# Patient Record
Sex: Female | Born: 1945
Health system: Southern US, Community
[De-identification: ages and names within clinical notes are randomized; demographics above are authoritative.]

## PROBLEM LIST (undated history)

## (undated) DIAGNOSIS — T7840XA Allergy, unspecified, initial encounter: Secondary | ICD-10-CM

## (undated) DIAGNOSIS — E039 Hypothyroidism, unspecified: Secondary | ICD-10-CM

## (undated) DIAGNOSIS — M1990 Unspecified osteoarthritis, unspecified site: Secondary | ICD-10-CM

## (undated) DIAGNOSIS — K2270 Barrett's esophagus without dysplasia: Secondary | ICD-10-CM

## (undated) DIAGNOSIS — E785 Hyperlipidemia, unspecified: Secondary | ICD-10-CM

## (undated) DIAGNOSIS — K219 Gastro-esophageal reflux disease without esophagitis: Secondary | ICD-10-CM

## (undated) DIAGNOSIS — J302 Other seasonal allergic rhinitis: Secondary | ICD-10-CM

## (undated) HISTORY — DX: Barrett's esophagus without dysplasia: K22.70

## (undated) HISTORY — DX: Other seasonal allergic rhinitis: J30.2

## (undated) HISTORY — DX: Hypothyroidism, unspecified: E03.9

## (undated) HISTORY — DX: Hyperlipidemia, unspecified: E78.5

## (undated) HISTORY — DX: Unspecified osteoarthritis, unspecified site: M19.90

## (undated) HISTORY — PX: ABDOMINAL HYSTERECTOMY: SHX81

## (undated) HISTORY — PX: NASAL SINUS SURGERY: SHX719

## (undated) HISTORY — DX: Allergy, unspecified, initial encounter: T78.40XA

## (undated) HISTORY — PX: CARPAL TUNNEL RELEASE: SHX101

## (undated) HISTORY — DX: Gastro-esophageal reflux disease without esophagitis: K21.9

---
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
## 1978-06-29 HISTORY — PX: TONSILLECTOMY: SUR1361

## 1998-06-29 HISTORY — PX: NEUROPLASTY / TRANSPOSITION MEDIAN NERVE AT CARPAL TUNNEL BILATERAL: SUR894

## 1998-09-20 ENCOUNTER — Ambulatory Visit (HOSPITAL_BASED_OUTPATIENT_CLINIC_OR_DEPARTMENT_OTHER): Admission: RE | Admit: 1998-09-20 | Discharge: 1998-09-20 | Payer: Self-pay | Admitting: Neurosurgery

## 1999-04-03 ENCOUNTER — Inpatient Hospital Stay (HOSPITAL_COMMUNITY): Admission: RE | Admit: 1999-04-03 | Discharge: 1999-04-04 | Payer: Self-pay | Admitting: Gynecology

## 2000-10-13 ENCOUNTER — Other Ambulatory Visit: Admission: RE | Admit: 2000-10-13 | Discharge: 2000-10-13 | Payer: Self-pay | Admitting: Gynecology

## 2000-11-19 ENCOUNTER — Encounter: Payer: Self-pay | Admitting: Family Medicine

## 2000-11-19 ENCOUNTER — Encounter: Admission: RE | Admit: 2000-11-19 | Discharge: 2000-11-19 | Payer: Self-pay | Admitting: Family Medicine

## 2000-12-22 ENCOUNTER — Encounter: Admission: RE | Admit: 2000-12-22 | Discharge: 2000-12-22 | Payer: Self-pay | Admitting: Family Medicine

## 2000-12-22 ENCOUNTER — Encounter: Payer: Self-pay | Admitting: Family Medicine

## 2001-02-16 ENCOUNTER — Other Ambulatory Visit: Admission: RE | Admit: 2001-02-16 | Discharge: 2001-02-16 | Payer: Self-pay | Admitting: Gastroenterology

## 2001-02-16 ENCOUNTER — Encounter (INDEPENDENT_AMBULATORY_CARE_PROVIDER_SITE_OTHER): Payer: Self-pay | Admitting: Specialist

## 2001-02-16 ENCOUNTER — Encounter: Payer: Self-pay | Admitting: Gastroenterology

## 2002-11-13 ENCOUNTER — Other Ambulatory Visit: Admission: RE | Admit: 2002-11-13 | Discharge: 2002-11-13 | Payer: Self-pay | Admitting: Gynecology

## 2003-11-19 ENCOUNTER — Other Ambulatory Visit: Admission: RE | Admit: 2003-11-19 | Discharge: 2003-11-19 | Payer: Self-pay | Admitting: Gynecology

## 2004-09-10 ENCOUNTER — Ambulatory Visit: Payer: Self-pay | Admitting: *Deleted

## 2004-11-18 ENCOUNTER — Ambulatory Visit: Payer: Self-pay | Admitting: Gastroenterology

## 2004-11-27 ENCOUNTER — Other Ambulatory Visit: Admission: RE | Admit: 2004-11-27 | Discharge: 2004-11-27 | Payer: Self-pay | Admitting: Gynecology

## 2005-06-05 ENCOUNTER — Ambulatory Visit: Payer: Self-pay | Admitting: Gastroenterology

## 2005-11-30 ENCOUNTER — Other Ambulatory Visit: Admission: RE | Admit: 2005-11-30 | Discharge: 2005-11-30 | Payer: Self-pay | Admitting: Gynecology

## 2006-11-05 ENCOUNTER — Ambulatory Visit: Payer: Self-pay | Admitting: Gastroenterology

## 2006-12-06 ENCOUNTER — Ambulatory Visit: Payer: Self-pay | Admitting: Gastroenterology

## 2006-12-07 ENCOUNTER — Encounter: Payer: Self-pay | Admitting: Gastroenterology

## 2006-12-13 ENCOUNTER — Other Ambulatory Visit: Admission: RE | Admit: 2006-12-13 | Discharge: 2006-12-13 | Payer: Self-pay | Admitting: Gynecology

## 2007-06-30 HISTORY — PX: ROTATOR CUFF REPAIR: SHX139

## 2008-02-08 ENCOUNTER — Telehealth: Payer: Self-pay | Admitting: Gastroenterology

## 2008-04-26 ENCOUNTER — Ambulatory Visit: Payer: Self-pay | Admitting: Internal Medicine

## 2008-05-09 ENCOUNTER — Telehealth: Payer: Self-pay | Admitting: Internal Medicine

## 2008-05-10 ENCOUNTER — Ambulatory Visit: Payer: Self-pay | Admitting: Internal Medicine

## 2008-06-29 HISTORY — PX: OTHER SURGICAL HISTORY: SHX169

## 2008-07-06 DIAGNOSIS — K219 Gastro-esophageal reflux disease without esophagitis: Secondary | ICD-10-CM | POA: Insufficient documentation

## 2008-07-06 DIAGNOSIS — K449 Diaphragmatic hernia without obstruction or gangrene: Secondary | ICD-10-CM | POA: Insufficient documentation

## 2008-07-06 DIAGNOSIS — Z8719 Personal history of other diseases of the digestive system: Secondary | ICD-10-CM | POA: Insufficient documentation

## 2008-07-10 ENCOUNTER — Ambulatory Visit: Payer: Self-pay | Admitting: Gastroenterology

## 2008-11-09 ENCOUNTER — Telehealth: Payer: Self-pay | Admitting: Gastroenterology

## 2008-12-26 ENCOUNTER — Encounter: Payer: Self-pay | Admitting: Internal Medicine

## 2009-05-27 ENCOUNTER — Encounter: Payer: Self-pay | Admitting: Internal Medicine

## 2010-01-07 ENCOUNTER — Telehealth: Payer: Self-pay | Admitting: Gastroenterology

## 2010-01-07 ENCOUNTER — Encounter: Payer: Self-pay | Admitting: Internal Medicine

## 2010-07-29 ENCOUNTER — Ambulatory Visit
Admission: RE | Admit: 2010-07-29 | Discharge: 2010-07-29 | Payer: Self-pay | Source: Home / Self Care | Attending: Gastroenterology | Admitting: Gastroenterology

## 2010-07-29 ENCOUNTER — Other Ambulatory Visit: Payer: Self-pay | Admitting: Gastroenterology

## 2010-07-29 DIAGNOSIS — E039 Hypothyroidism, unspecified: Secondary | ICD-10-CM | POA: Insufficient documentation

## 2010-07-29 LAB — CBC WITH DIFFERENTIAL/PLATELET
Basophils Absolute: 0 10*3/uL (ref 0.0–0.1)
Basophils Relative: 0.3 % (ref 0.0–3.0)
Eosinophils Absolute: 0.1 10*3/uL (ref 0.0–0.7)
Eosinophils Relative: 2.3 % (ref 0.0–5.0)
HCT: 37.7 % (ref 36.0–46.0)
Hemoglobin: 13 g/dL (ref 12.0–15.0)
Lymphocytes Relative: 24.5 % (ref 12.0–46.0)
Lymphs Abs: 1.5 10*3/uL (ref 0.7–4.0)
MCHC: 34.5 g/dL (ref 30.0–36.0)
MCV: 94.2 fl (ref 78.0–100.0)
Monocytes Absolute: 0.4 10*3/uL (ref 0.1–1.0)
Monocytes Relative: 6.5 % (ref 3.0–12.0)
Neutro Abs: 4 10*3/uL (ref 1.4–7.7)
Neutrophils Relative %: 66.4 % (ref 43.0–77.0)
Platelets: 231 10*3/uL (ref 150.0–400.0)
RBC: 4 Mil/uL (ref 3.87–5.11)
RDW: 13.1 % (ref 11.5–14.6)
WBC: 6.1 10*3/uL (ref 4.5–10.5)

## 2010-07-29 LAB — VITAMIN B12: Vitamin B-12: 483 pg/mL (ref 211–911)

## 2010-07-29 LAB — BASIC METABOLIC PANEL
BUN: 14 mg/dL (ref 6–23)
CO2: 33 mEq/L — ABNORMAL HIGH (ref 19–32)
Calcium: 9 mg/dL (ref 8.4–10.5)
Chloride: 105 mEq/L (ref 96–112)
Creatinine, Ser: 0.7 mg/dL (ref 0.4–1.2)
GFR: 92.9 mL/min (ref >60.00)
Glucose, Bld: 80 mg/dL (ref 70–99)
Potassium: 3.9 mEq/L (ref 3.5–5.1)
Sodium: 143 mEq/L (ref 135–145)

## 2010-07-29 LAB — TSH: TSH: 6.35 u[IU]/mL — ABNORMAL HIGH (ref 0.35–5.50)

## 2010-07-29 LAB — HEPATIC FUNCTION PANEL
ALT: 36 U/L — ABNORMAL HIGH (ref 0–35)
AST: 25 U/L (ref 0–37)
Albumin: 3.8 g/dL (ref 3.5–5.2)
Alkaline Phosphatase: 138 U/L — ABNORMAL HIGH (ref 39–117)
Bilirubin, Direct: 0.1 mg/dL (ref 0.0–0.3)
Total Bilirubin: 0.2 mg/dL — ABNORMAL LOW (ref 0.3–1.2)
Total Protein: 6.4 g/dL (ref 6.0–8.3)

## 2010-07-29 LAB — IBC PANEL
Iron: 39 ug/dL — ABNORMAL LOW (ref 42–145)
Saturation Ratios: 10.6 % — ABNORMAL LOW (ref 20.0–50.0)
Transferrin: 262.3 mg/dL (ref 212.0–360.0)

## 2010-07-29 LAB — FERRITIN: Ferritin: 66.8 ng/mL (ref 10.0–291.0)

## 2010-07-29 LAB — FOLATE: Folate: 12.1 ng/mL (ref >5.9)

## 2010-07-31 NOTE — Progress Notes (Signed)
Summary: Nexium Refill  Medications Added NEXIUM 40 MG  CPDR (ESOMEPRAZOLE MAGNESIUM) 1 capsule twice a day 30 minutes before meals       Phone Note From Pharmacy   Summary of Call: Fax rec from [facility] req refill for Nexium two times a day. Initial call taken by: Ashok Cordia RN,  January 07, 2010 8:25 AM    New/Updated Medications: NEXIUM 40 MG  CPDR (ESOMEPRAZOLE MAGNESIUM) 1 capsule twice a day 30 minutes before meals Prescriptions: NEXIUM 40 MG  CPDR (ESOMEPRAZOLE MAGNESIUM) 1 capsule twice a day 30 minutes before meals  #60 x 6   Entered by:   Ashok Cordia RN   Authorized by:   Mardella Layman MD [facility]   Signed by:   Ashok Cordia RN on 01/07/2010   Method used:   Electronically to        [facility]* (retail)       [address]       [place], [place]  [address]       Ph: [phone]       Fax: [phone]   RxID:   [id number]

## 2010-07-31 NOTE — Letter (Signed)
Summary: Beather Arbour MD  Beather Arbour MD   Imported By: Lanelle Bal 01/17/2010 10:13:06  _____________________________________________________________________  External Attachment:    Type:   Image     Comment:   External Document

## 2010-08-06 NOTE — Assessment & Plan Note (Signed)
Summary: barretts follow up/lk    History of Present Illness Visit Type: Follow-up Visit Primary GI MD: Sheryn Bison MD FACP FAGA Primary Provider: Beather Arbour, MD Requesting Provider: n/a Chief Complaint: Patient here for routine follow up on Barretts Esophagus. She also needs refills of Nexium. History of Present Illness:   65 year old Caucasian female with chronic acid reflux and Barrett's mucosa doing well on Nexium 40 mg twice a day. She denies GI complaints, specifically chest pain, dysphagia, nausea vomiting, bowel irregularity, melena or hematochezia. Her last colonoscopy was 10 years ago. Family history is noncontributory. In addition the Nexium she is on aspirin 81 mg a day, Synthroid, and Zocor.   GI Review of Systems    Reports acid reflux.      Denies abdominal pain, belching, bloating, chest pain, dysphagia with liquids, dysphagia with solids, heartburn, loss of appetite, nausea, vomiting, vomiting blood, weight loss, and  weight gain.        Denies anal fissure, black tarry stools, change in bowel habit, constipation, diarrhea, diverticulosis, fecal incontinence, heme positive stool, hemorrhoids, irritable bowel syndrome, jaundice, light color stool, liver problems, rectal bleeding, and  rectal pain. Preventive Screening-Counseling & Management  Caffeine-Diet-Exercise     Does Patient Exercise: yes    Current Medications (verified): 1)  Nexium 40 Mg  Cpdr (Esomeprazole Magnesium) .Marland Kitchen.. 1 Capsule Twice A Day 30 Minutes Before Meals 2)  Aspirin 81 Mg  Tabs (Aspirin) .Marland Kitchen.. 1 Tablet By Mouth Once Daily 3)  Calcium Carbonate   Powd (Calcium Carbonate) .Marland Kitchen.. 1 Tablet By Mouth Once Daily 4)  Vitamin D 1000 Unit  Tabs (Cholecalciferol) .Marland Kitchen.. 1 Tablet By Mouth Once Daily 5)  Dulcolax Stool Softener 100 Mg Caps (Docusate Sodium) .... As Needed 6)  Synthroid 50 Mcg Tabs (Levothyroxine Sodium) .... Take 1 Tablet By Mouth Once A Day 7)  Zocor 10 Mg Tabs (Simvastatin) .... Take 1  Tablet By Mouth Once A Day  Allergies (verified): No Known Drug Allergies  Past History:  Past medical, surgical, family and social histories (including risk factors) reviewed for relevance to current acute and chronic problems.  Past Medical History: Reviewed history from 07/06/2008 and no changes required. Current Problems:  HIATAL HERNIA (ICD-553.3) GERD (ICD-530.81) BARRETT'S ESOPHAGUS, HX OF (ICD-V12.79)  Past Surgical History: Hysterectomy Sinus surgery appendectomy carpal tunnel release-bilateral T & A  Bladder Tack Rotator Cuff Repair and spur removal of the right shoulder trigger thumb release-right  Family History: Reviewed history from 07/10/2008 and no changes required. Family History of Breast Cancer: Maternal Aunt No FH of Colon Cancer: Family History of Diabetes: Paternal Aunt x 2 Family History of Heart Disease: Brother, father, grandfather Family History of Pancreatic Cancer:Maternal Aunt  Social History: Reviewed history from 07/10/2008 and no changes required. Alcohol Use - yes-seldom Illicit Drug Use - no Patient has never smoked.  Daily Caffeine Use Patient gets regular exercise. Does Patient Exercise:  yes  Review of Systems       The patient complains of allergy/sinus.  The patient denies anemia, anxiety-new, arthritis/joint pain, back pain, blood in urine, breast changes/lumps, change in vision, confusion, cough, coughing up blood, depression-new, fainting, fatigue, fever, headaches-new, hearing problems, heart murmur, heart rhythm changes, itching, menstrual pain, muscle pains/cramps, night sweats, nosebleeds, pregnancy symptoms, shortness of breath, skin rash, sleeping problems, sore throat, swelling of feet/legs, swollen lymph glands, thirst - excessive , urination - excessive , urination changes/pain, urine leakage, vision changes, and voice change.    Vital Signs:  Patient profile:  65 year old female Height:      65 inches Weight:       151 pounds BMI:     25.22 BSA:     1.76 Pulse rate:   88 / minute Pulse rhythm:   regular BP sitting:   120 / 78  (left arm)  Vitals Entered By: Lamona Curl CMA Duncan Dull) (July 29, 2010 3:02 PM)  Physical Exam  General:  Well developed, well nourished, no acute distress.healthy appearing.   Head:  Normocephalic and atraumatic. Eyes:  PERRLA, no icterus.exam deferred to patient's ophthalmologist.   Nose:  nasal congestion and slight tenderness over the frontal sinus area. Neck:  Supple; no masses or thyromegaly. Lungs:  Clear throughout to auscultation. Heart:  Regular rate and rhythm; no murmurs, rubs,  or bruits. Abdomen:  Soft, nontender and nondistended. No masses, hepatosplenomegaly or hernias noted. Normal bowel sounds. Msk:  Symmetrical with no gross deformities. Normal posture. Extremities:  No clubbing, cyanosis, edema or deformities noted. Neurologic:  Alert and  oriented x4;  grossly normal neurologically. Cervical Nodes:  No significant cervical adenopathy. Psych:  Alert and cooperative. Normal mood and affect.   Impression & Recommendations:  Problem # 1:  BARRETT'S ESOPHAGUS, HX OF (ICD-V12.79) Assessment Unchanged Followup endoscopy and esophageal biopsy scheduled at her convenience. She is to continue her antireflux maneuvers and daily PPI therapy.Screening labs ordered per chronic PPI use. Orders: TLB-CBC Platelet - w/Differential (85025-CBCD) TLB-BMP (Basic Metabolic Panel-BMET) (80048-METABOL) TLB-Hepatic/Liver Function Pnl (80076-HEPATIC) TLB-TSH (Thyroid Stimulating Hormone) (84443-TSH) TLB-B12, Serum-Total ONLY ([address]-U04) TLB-Ferritin (82728-FER) TLB-Folic Acid (Folate) (82746-FOL) TLB-IBC Pnl (Iron/FE;Transferrin) (83550-IBC)  Problem # 2:  SCREENING COLORECTAL-CANCER (ICD-V76.51) Assessment: Unchanged colonoscopy also scheduled her convenience.  Problem # 3:  HYPOTHYROIDISM (ICD-244.9) Assessment: Improved continue Synthroid and Zocor as  per Dr. Beather Arbour.  Patient Instructions: 1)  Copy sent to : Beather Arbour, MD 2)  Please go to the basement today for your labs.  3)  Your prescription(s) have been sent to you pharmacy.  4)  Call back to schedule your colonoscopy & endoscopy and previsit. 5)  The medication list was reviewed and reconciled.  All changed / newly prescribed medications were explained.  A complete medication list was provided to the patient / caregiver. 6)  Barrett's Esophagus brochure given.  7)  Avoid foods high in acid content ( tomatoes, citrus juices, spicy foods) . Avoid eating within 3 to 4 hours of lying down or before exercising. Do not over eat; try smaller more frequent meals. Elevate head of bed four inches when sleeping.  Prescriptions: ZITHROMAX Z-PAK 250 MG TABS (AZITHROMYCIN) take as directed  #1 pak x 0   Entered by:   Harlow Mares CMA (AAMA)   Authorized by:   Mardella Layman MD [facility]   Signed by:   Mardella Layman MD [facility] on 07/29/2010   Method used:   Electronically to        [facility]* (retail)       [address]       [place], [place]  [address]       Ph: [phone]       Fax: [phone]   RxID:   [id number] NEXIUM 40 MG  CPDR (ESOMEPRAZOLE MAGNESIUM) 1 capsule twice a day 30 minutes before meals  #180 x 3   Entered by:   Harlow Mares CMA (AAMA)   Authorized by:   Mardella Layman MD [facility]   Signed by:   Harlow Mares CMA (AAMA) on 07/29/2010  Method used:   Faxed to ...       [address] [facility] (mail-order)       [address]       [place], [place]  [address]       Ph: [phone]       Fax: [phone]   RxID:   [id number]

## 2010-08-13 ENCOUNTER — Telehealth: Payer: Self-pay | Admitting: Gastroenterology

## 2010-08-20 NOTE — Progress Notes (Signed)
Summary: Results   Phone Note Call from Patient Call back at [phone]   Caller: Patient Call For: Dr. Jarold Motto Reason for Call: Talk to Nurse Summary of Call: Pt is calling for results from her blood work Initial call taken by: [place] Johnson,  August 13, 2010 9:03 AM  Follow-up for Phone Call        advised of labs and biscommed to lomax again.  Follow-up by: Harlow Mares CMA (AAMA),  August 13, 2010 9:09 AM

## 2010-11-11 NOTE — Assessment & Plan Note (Signed)
[facility]                         GASTROENTEROLOGY OFFICE NOTE   Monique, Ellis                           MRN:          [id number]  DATE:11/05/2006                            DOB:          15-Feb-1946    Monique Ellis is a 65 year old white female, who has chronic acid reflux  with constant throat-clearing and hoarseness, partially relieved by  taking Nexium 40 mg twice a day.  She does have endoscopically-  documented Barrett's mucosa in her distal esophagus with a negative  endoscopy approximately three years ago, without any dysplasia.  She  also had screening colonoscopy in August of 2002 that was unremarkable  and she has no family history of colon carcinoma or colonic polyposis.   She is doing well currently and denies other complaints, except for a  globus sensation in her throat.  Her bowels are moving normally and she  denies melena or hematochezia.  Her appetite is good and her weight is  stable.  She denies any new medical problems since she was last seen and  she is taking daily Metamucil, calcium with vitamin D daily, Vivelle  patch and Singulair 10 mg a day.  She does Hemoccult cards for Dr. Nicholas Lose  yearly and these apparently have been negative.   She is a healthy-appearing, attractive, white female, in no distress.  She weighs 148 pounds.  Her blood pressure is 130/78.  Pulse was 80 and  regular.  I could not appreciate stigmata of chronic liver disease.  Her  chest was clear.  She appeared to be in a regular rhythm without  murmurs, gallops or rubs.  There was no hepatosplenomegaly, abdominal  masses or tenderness.  Bowel sounds were normal.   ASSESSMENT:  1. Chronic GERD and Barrett's mucosa with some mild continued      extraesophageal manifestations of GERD, despite twice-a-day PPI      therapy.  This patient has had previous 24-hour pH probe testing,      but I cannot see where she has actually had manometry done.  2. Increased  personal stress over the last several years with multiple      deaths in her family.  She denies any problems with anxiety or      depression at this time, however.   RECOMMENDATIONS:  1. Anti-reflux maneuvers again reviewed with patient.  2. Continue twice-a-day Nexium therapy.  I have advised her to take      this at least 30 minutes before meals.  3. Followup endoscopy at her convenience.  4. Continue yearly Hemoccult cards with colonoscopy in five years'      time, unless otherwise indicated.     Vania Rea. Jarold Motto, MD, [facility]ale[facility] Essex, FAGA  Electronically Signed    DRP/MedQ  DD: 11/05/2006  DT: 11/05/2006  Job #: [id number]   cc:   Vale Haven. Andrey Campanile, M.D.  Gretta Cool, M.D.

## 2011-01-13 ENCOUNTER — Other Ambulatory Visit: Payer: Self-pay | Admitting: Gynecology

## 2011-03-18 ENCOUNTER — Encounter: Payer: Self-pay | Admitting: Gastroenterology

## 2011-03-18 NOTE — Progress Notes (Signed)
Pt was seen last 07/29/2010 for barretts follow up and was advised to call back to schedule her ECL but she has yet to call back, I received a request for her Nexium I have faxed back the form and denied the medication since she has not been seen in the office in over a year and she needs to have her procedures done as well. Once she is seen in the office we will refill her medication but not until.

## 2011-03-27 ENCOUNTER — Encounter: Payer: Self-pay | Admitting: Gastroenterology

## 2011-03-31 ENCOUNTER — Other Ambulatory Visit: Payer: Self-pay | Admitting: Gastroenterology

## 2011-03-31 MED ORDER — ESOMEPRAZOLE MAGNESIUM 40 MG PO CPDR: 40.0000 mg | DELAYED_RELEASE_CAPSULE | Freq: Two times a day (BID) | ORAL | Status: DC

## 2011-03-31 NOTE — Telephone Encounter (Signed)
Rx Sent to pharmacy.  

## 2011-08-17 ENCOUNTER — Encounter: Payer: Self-pay | Admitting: Gastroenterology

## 2011-08-27 ENCOUNTER — Other Ambulatory Visit: Payer: Self-pay | Admitting: Gastroenterology

## 2011-08-27 ENCOUNTER — Ambulatory Visit (AMBULATORY_SURGERY_CENTER): Payer: 59 | Admitting: *Deleted

## 2011-08-27 ENCOUNTER — Encounter: Payer: Self-pay | Admitting: Gastroenterology

## 2011-08-27 VITALS — Ht 65.0 in | Wt 152.0 lb

## 2011-08-27 DIAGNOSIS — K2270 Barrett's esophagus without dysplasia: Secondary | ICD-10-CM

## 2011-08-27 DIAGNOSIS — Z1211 Encounter for screening for malignant neoplasm of colon: Secondary | ICD-10-CM

## 2011-08-27 MED ORDER — PEG-KCL-NACL-NASULF-NA ASC-C 100 G PO SOLR: ORAL | Status: DC

## 2011-08-27 NOTE — Telephone Encounter (Addendum)
Left message to call back pt last ov 07/29/2010 she will need ov after her egd to follow up on her barretts. We can send a 30 day supply to her pharmacy.

## 2011-08-31 DIAGNOSIS — K2270 Barrett's esophagus without dysplasia: Secondary | ICD-10-CM | POA: Insufficient documentation

## 2011-08-31 DIAGNOSIS — Z1211 Encounter for screening for malignant neoplasm of colon: Secondary | ICD-10-CM | POA: Insufficient documentation

## 2011-08-31 MED ORDER — ESOMEPRAZOLE MAGNESIUM 40 MG PO CPDR: 40.0000 mg | DELAYED_RELEASE_CAPSULE | Freq: Two times a day (BID) | ORAL | Status: DC

## 2011-09-01 ENCOUNTER — Telehealth: Payer: Self-pay | Admitting: Gastroenterology

## 2011-09-01 NOTE — Telephone Encounter (Signed)
Problem resolved yesterday

## 2011-09-11 ENCOUNTER — Telehealth: Payer: Self-pay | Admitting: Gastroenterology

## 2011-09-11 ENCOUNTER — Encounter: Payer: Self-pay | Admitting: Gastroenterology

## 2011-09-11 ENCOUNTER — Ambulatory Visit (AMBULATORY_SURGERY_CENTER): Payer: 59 | Admitting: Gastroenterology

## 2011-09-11 VITALS — BP 138/85 | HR 75 | Temp 97.1°F | Resp 18 | Ht 65.0 in | Wt 152.0 lb

## 2011-09-11 DIAGNOSIS — K2990 Gastroduodenitis, unspecified, without bleeding: Secondary | ICD-10-CM

## 2011-09-11 DIAGNOSIS — K2270 Barrett's esophagus without dysplasia: Secondary | ICD-10-CM

## 2011-09-11 DIAGNOSIS — K209 Esophagitis, unspecified without bleeding: Secondary | ICD-10-CM

## 2011-09-11 DIAGNOSIS — K2970 Gastritis, unspecified, without bleeding: Secondary | ICD-10-CM

## 2011-09-11 DIAGNOSIS — K219 Gastro-esophageal reflux disease without esophagitis: Secondary | ICD-10-CM

## 2011-09-11 DIAGNOSIS — Z8719 Personal history of other diseases of the digestive system: Secondary | ICD-10-CM

## 2011-09-11 DIAGNOSIS — Z1211 Encounter for screening for malignant neoplasm of colon: Secondary | ICD-10-CM

## 2011-09-11 MED ORDER — ESOMEPRAZOLE MAGNESIUM 40 MG PO CPDR: 40.0000 mg | DELAYED_RELEASE_CAPSULE | Freq: Two times a day (BID) | ORAL | Status: DC

## 2011-09-11 MED ORDER — SODIUM CHLORIDE 0.9 % IV SOLN: 500.0000 mL | INTRAVENOUS | Status: DC

## 2011-09-11 NOTE — Progress Notes (Signed)
Patient did not experience any of the following events: a burn prior to discharge; a fall within the facility; wrong site/side/patient/procedure/implant event; or a hospital transfer or hospital admission upon discharge from the facility. (G8907) Patient did not have preoperative order for IV antibiotic SSI prophylaxis. (G8918)  

## 2011-09-11 NOTE — Op Note (Signed)
[facility] [address] [place], [place]  [address]  COLONOSCOPY PROCEDURE REPORT  PATIENT:  Monique, Ellis  MR#:  [id number] BIRTHDATE:  1945/10/08, 66 yrs. old  GENDER:  female ENDOSCOPIST:  Vania Rea. Jarold Motto, MD, [facility] REF. BY: PROCEDURE DATE:  09/11/2011 PROCEDURE:  Average-risk screening colonoscopy G0121 ASA CLASS:  Class II INDICATIONS:  Routine Risk Screening MEDICATIONS:   propofol (Diprivan) 180 mg IV  DESCRIPTION OF PROCEDURE:   After the risks and benefits and of the procedure were explained, informed consent was obtained. Digital rectal exam was performed and revealed no abnormalities. The LB 180AL [id number] endoscope was introduced through the anus and advanced to the cecum, which was identified by both the appendix and ileocecal valve.  The quality of the prep was excellent, using MoviPrep.  The instrument was then slowly withdrawn as the colon was fully examined. <<PROCEDUREIMAGES>>  FINDINGS:  No polyps or cancers were seen.  This was otherwise a normal examination of the colon.   Retroflexed views in the rectum revealed no abnormalities.    The scope was then withdrawn from the patient and the procedure completed.  COMPLICATIONS:  None ENDOSCOPIC IMPRESSION: 1) No polyps or cancers 2) Otherwise normal examination RECOMMENDATIONS: 1) Continue current colorectal screening recommendations for "routine risk" patients with a repeat colonoscopy in 10 years.  REPEAT EXAM:  No  ______________________________ Vania Rea. Jarold Motto, MD, [facility]  CC:  Karie Schwalbe, MD  n. Rosalie DoctorMarland Kitchen   Vania Rea. Armie Moren at 09/11/2011 02:55 PM  Susanne Greenhouse, [phone]

## 2011-09-11 NOTE — Patient Instructions (Signed)

## 2011-09-11 NOTE — Telephone Encounter (Signed)
Attempted to contact patient and she states that I have the wrong number. Called and left a message at home number.

## 2011-09-11 NOTE — Op Note (Signed)
[facility] [address] [place], [place]  [address]  ENDOSCOPY PROCEDURE REPORT  PATIENT:  Monique, Ellis  MR#:  [id number] BIRTHDATE:  [birth year], 66 yrs. old  GENDER:  female  ENDOSCOPIST:  Vania Rea. Jarold Motto, MD, [facility] Referred by:  PROCEDURE DATE:  09/11/2011 PROCEDURE:  EGD with biopsy, 43239, EGD with biopsy for H. pylori 19147 ASA CLASS:  Class II INDICATIONS:  h/o Barrett's Esophagus  MEDICATIONS:   There was residual sedation effect present from prior procedure., propofol (Diprivan) 100 mg IV TOPICAL ANESTHETIC:  DESCRIPTION OF PROCEDURE:   After the risks and benefits of the procedure were explained, informed consent was obtained.  The LB GIF-H180 [id number] endoscope was introduced through the mouth and advanced to the second portion of the duodenum.  The instrument was slowly withdrawn as the mucosa was fully examined. <<PROCEDUREIMAGES>>  Mild gastritis was found in the antrum. CLO BX. DONE  Otherwise the examination was normal. GE JUNCTION BIOPSIED.?? SHORT SEGMENT BARRETT'S.    Retroflexed views revealed no abnormalities.    The scope was then withdrawn from the patient and the procedure completed.  COMPLICATIONS:  None  ENDOSCOPIC IMPRESSION: 1) Mild gastritis in the antrum 2) Otherwise normal examination HX OF CHRONIC GERD ON RX.R/O BARRETT'S MUCOSA RECOMMENDATIONS: 1) Await biopsy results 2) Rx CLO if positive 3) continue current medications  ______________________________ Vania Rea. Jarold Motto, MD, [facility]  CC:  Karie Schwalbe, MD  n. Rosalie DoctorMarland Kitchen   Vania Rea. Kandon Hosking at 09/11/2011 03:09 PM  Susanne Greenhouse, [phone]

## 2011-09-11 NOTE — Telephone Encounter (Signed)
Pt walked in and wanted to have her Nexium prescription resent because it was sent as qty of 90 and should have read qty of 180 for 90 day supply.  Rx was corrected and resent.  Pt aware

## 2011-09-14 ENCOUNTER — Encounter: Payer: Self-pay | Admitting: Gastroenterology

## 2011-09-14 ENCOUNTER — Telehealth: Payer: Self-pay | Admitting: *Deleted

## 2011-09-14 LAB — HELICOBACTER PYLORI SCREEN-BIOPSY: UREASE: NEGATIVE

## 2011-09-14 NOTE — Telephone Encounter (Signed)
  Follow up Call-  Call back number 09/11/2011  Post procedure Call Back phone  # [phone]  Permission to leave phone message Yes     Patient questions:  Do you have a fever, pain , or abdominal swelling? no Pain Score  0 *  Have you tolerated food without any problems? yes  Have you been able to return to your normal activities? yes  Do you have any questions about your discharge instructions: Diet   no Medications  no Follow up visit  no  Do you have questions or concerns about your Care? no  Actions: * If pain score is 4 or above: No action needed, pain <4.

## 2011-09-15 ENCOUNTER — Encounter: Payer: Self-pay | Admitting: Gastroenterology

## 2011-10-08 ENCOUNTER — Other Ambulatory Visit: Payer: Self-pay | Admitting: Dermatology

## 2011-11-25 ENCOUNTER — Encounter: Payer: Self-pay | Admitting: *Deleted

## 2011-11-26 ENCOUNTER — Ambulatory Visit (INDEPENDENT_AMBULATORY_CARE_PROVIDER_SITE_OTHER): Payer: 59 | Admitting: Gastroenterology

## 2011-11-26 ENCOUNTER — Encounter: Payer: Self-pay | Admitting: Gastroenterology

## 2011-11-26 VITALS — BP 128/78 | HR 76 | Ht 65.0 in | Wt 152.6 lb

## 2011-11-26 DIAGNOSIS — Z8719 Personal history of other diseases of the digestive system: Secondary | ICD-10-CM

## 2011-11-26 MED ORDER — ESOMEPRAZOLE MAGNESIUM 40 MG PO CPDR: 40.0000 mg | DELAYED_RELEASE_CAPSULE | Freq: Two times a day (BID) | ORAL | Status: DC

## 2011-11-26 NOTE — Patient Instructions (Signed)
Your prescription(s) have been sent to you pharmacy.   

## 2011-11-26 NOTE — Progress Notes (Signed)
History of Present Illness: This is a extremely pleasant 66 year old Caucasian female with chronic acid reflux. There was some question as to possible Barrett's mucosa, but recent endoscopy and esophageal biopsies was unremarkable. Biopsies for H. pylori also were negative. Screening colonoscopy was unremarkable. This patient's symptoms in the past have been mostly extra esophageal with a globus sensation and throat clearing. Currently on Nexium 40 mg a day she is asymptomatic. Review of her labs shows no specific abnormalities.    Current Medications, Allergies, Past Medical History, Past Surgical History, Family History and Social History were reviewed in [employer] record.   Assessment and plan: Chronic GERD without Barrett's mucosa. She is doing well on daily PPI therapy, B12 levels have been normal, and she is on calcium and vitamin D per Dr. Nicholas Lose. We will see her on when necessary basis as needed. Exam today showed her blood pressure to 120/78, pulse 76 and regular, weight 152 pounds, and BMI 25.39. Please copy to Dr. Beather Arbour with this note. No diagnosis found.

## 2012-01-14 ENCOUNTER — Other Ambulatory Visit: Payer: Self-pay | Admitting: Gynecology

## 2012-02-03 ENCOUNTER — Telehealth: Payer: Self-pay | Admitting: Gastroenterology

## 2012-02-03 NOTE — Telephone Encounter (Signed)
I have requested a prior authorization form.

## 2012-02-03 NOTE — Telephone Encounter (Signed)
Prior auth initiated.

## 2012-07-04 ENCOUNTER — Telehealth: Payer: Self-pay | Admitting: Gastroenterology

## 2012-07-05 NOTE — Telephone Encounter (Signed)
Prior Authorization was started in August by Karen Kitchens and patient called yesterday to check on status.  I called [facility] and spoke with Caryn Bee to check on status. Caryn Bee stated that prior authorization is not required, the problem is with pharmacy/formulary.  I was transferred to pharmacy and spoke with [facility].  Renee stated that the Nexium extended release capsules are a plan exclusive and if patient wants this medication she will have to pay out of pocket $1300.00 monthly. Renee stated that even Nexium capsules will not be covered under formulary. The only Nexium that is covered is the powder form.  I called patient to advise her of this.  Patient said that she has this problem all the time with insurance company and she will call them herself and call me back

## 2012-11-30 ENCOUNTER — Other Ambulatory Visit: Payer: Self-pay | Admitting: *Deleted

## 2012-11-30 ENCOUNTER — Telehealth: Payer: Self-pay | Admitting: Gastroenterology

## 2012-11-30 MED ORDER — ESOMEPRAZOLE MAGNESIUM 40 MG PO CPDR: 40.0000 mg | DELAYED_RELEASE_CAPSULE | Freq: Two times a day (BID) | ORAL | Status: DC

## 2012-11-30 MED ORDER — ESOMEPRAZOLE MAGNESIUM 40 MG PO CPDR: 40.0000 mg | DELAYED_RELEASE_CAPSULE | Freq: Every day | ORAL | Status: DC

## 2012-11-30 NOTE — Telephone Encounter (Signed)
Rx sent 

## 2012-11-30 NOTE — Telephone Encounter (Signed)
Sent rx for once daily when it should be twice daily Changed to twice daily Called pharmacy and they made note in system because prescription will not show for 24 hrs

## 2013-02-09 ENCOUNTER — Other Ambulatory Visit: Payer: Self-pay | Admitting: Gastroenterology

## 2013-06-01 ENCOUNTER — Other Ambulatory Visit: Payer: Self-pay | Admitting: Gastroenterology

## 2013-11-09 ENCOUNTER — Encounter: Payer: Self-pay | Admitting: Internal Medicine

## 2013-11-13 ENCOUNTER — Encounter: Payer: Self-pay | Admitting: Internal Medicine

## 2013-11-13 ENCOUNTER — Other Ambulatory Visit (INDEPENDENT_AMBULATORY_CARE_PROVIDER_SITE_OTHER): Payer: 59

## 2013-11-13 ENCOUNTER — Ambulatory Visit (INDEPENDENT_AMBULATORY_CARE_PROVIDER_SITE_OTHER): Payer: 59 | Admitting: Internal Medicine

## 2013-11-13 VITALS — BP 140/82 | HR 84 | Ht 65.0 in | Wt 152.0 lb

## 2013-11-13 DIAGNOSIS — K2270 Barrett's esophagus without dysplasia: Secondary | ICD-10-CM

## 2013-11-13 DIAGNOSIS — K219 Gastro-esophageal reflux disease without esophagitis: Secondary | ICD-10-CM

## 2013-11-13 LAB — MAGNESIUM: MAGNESIUM: 1.8 mg/dL (ref 1.5–2.5)

## 2013-11-13 MED ORDER — ESOMEPRAZOLE MAGNESIUM 40 MG PO CPDR: 40.0000 mg | DELAYED_RELEASE_CAPSULE | Freq: Two times a day (BID) | ORAL | Status: DC

## 2013-11-13 NOTE — Patient Instructions (Signed)
Your physician has requested that you go to the basement for the following lab work before leaving today: magnesium   We have sent the following medications to your pharmacy for you to pick up at your convenience: Esomeprazole 40 mg twice a day  Follow up in 1 year

## 2013-11-13 NOTE — Progress Notes (Signed)
   Subjective:    Patient ID: Monique Ellis, female    DOB: [birth year], 68 y.o.   MRN: [id number]  HPI Monique Ellis is a 68 year old female with a past medical history of GERD with remote Barrett's esophagus, hypothyroidism, hyperlipidemia who is seen for followup. She is here alone today. She was previously followed by Dr. Verl Blalock before his retirement. She reports she is feeling well. She denies heartburn. No trouble swallowing. Good appetite. No nausea vomiting. Normal bowel habits without blood in her stool or melena. No diarrhea or constipation. No abdominal pain. She is taking Nexium 40 mg twice daily. She denies hepatobiliary complaint.  Records reviewed, EGD from 2013 negative for Barrett's, EGD 2008 negative for Barrett's, 2002 EGD positive for Barrett's without dysplasia   Review of Systems As per history of present illness, otherwise negative  Current Medications, Allergies, Past Medical History, Past Surgical History, Family History and Social History were reviewed in [employer] record.     Objective:   Physical Exam BP 140/82  Pulse 84  Ht 5\' 5"  (1.651 m)  Wt 152 lb (68.947 kg)  BMI 25.29 kg/m2 Constitutional: Well-developed and well-nourished. No distress. HEENT: Normocephalic and atraumatic. Oropharynx is clear and moist. No oropharyngeal exudate. Conjunctivae are normal.  No scleral icterus. Neck: Neck supple. Trachea midline. Cardiovascular: Normal rate, regular rhythm and intact distal pulses. No M/R/G Pulmonary/chest: Effort normal and breath sounds normal. No wheezing, rales or rhonchi. Abdominal: Soft, nontender, nondistended. Bowel sounds active throughout. There are no masses palpable. No hepatosplenomegaly. Extremities: no clubbing, cyanosis, or edema Lymphadenopathy: No cervical adenopathy noted. Neurological: Alert and oriented to person place and time. Skin: Skin is warm and dry. No rashes noted. Psychiatric: Normal mood and  affect. Behavior is normal.      Assessment & Plan:  68 year old female with a past medical history of GERD with remote Barrett's esophagus, hypothyroidism, hyperlipidemia who is seen for followup.  1.  GERD with remote Barrett's esophagus -- well controlled, asymptomatic GERD. She is taking Nexium 40 mg twice daily. She will give a trial of decrease in Nexium to once daily. If she has return of heartburn or reflux symptoms she came to back to twice daily. She had Barrett's esophagus on one biopsy in 2002 but not in 2008 or 2013.  It is quite possible her Barrett's esophagus regressed. We can consider repeating endoscopy in 2018.  We will prescribe generic Nexium.  2.  CRC screening -- up-to-date last colonoscopy 2013, repeat recommended 2023  Return in 1 yr, sooner PRN

## 2013-11-28 ENCOUNTER — Telehealth: Payer: Self-pay | Admitting: Internal Medicine

## 2013-11-28 DIAGNOSIS — K219 Gastro-esophageal reflux disease without esophagitis: Secondary | ICD-10-CM

## 2013-11-29 MED ORDER — ESOMEPRAZOLE MAGNESIUM 40 MG PO CPDR: 40.0000 mg | DELAYED_RELEASE_CAPSULE | Freq: Two times a day (BID) | ORAL | Status: AC

## 2013-11-29 NOTE — Telephone Encounter (Signed)
Sent esomeprazole into pt's pharmacy

## 2014-11-13 ENCOUNTER — Other Ambulatory Visit: Payer: Self-pay | Admitting: Internal Medicine

## 2018-04-02 DIAGNOSIS — Z23 Encounter for immunization: Secondary | ICD-10-CM | POA: Diagnosis not present

## 2018-06-14 DIAGNOSIS — Z01419 Encounter for gynecological examination (general) (routine) without abnormal findings: Secondary | ICD-10-CM | POA: Diagnosis not present

## 2018-06-14 DIAGNOSIS — Z79890 Hormone replacement therapy (postmenopausal): Secondary | ICD-10-CM | POA: Diagnosis not present

## 2018-06-14 DIAGNOSIS — H00011 Hordeolum externum right upper eyelid: Secondary | ICD-10-CM | POA: Diagnosis not present

## 2018-06-14 DIAGNOSIS — Z780 Asymptomatic menopausal state: Secondary | ICD-10-CM | POA: Diagnosis not present

## 2018-07-12 DIAGNOSIS — R69 Illness, unspecified: Secondary | ICD-10-CM | POA: Diagnosis not present

## 2018-07-25 ENCOUNTER — Other Ambulatory Visit: Payer: Self-pay

## 2018-07-25 ENCOUNTER — Emergency Department (HOSPITAL_BASED_OUTPATIENT_CLINIC_OR_DEPARTMENT_OTHER)
Admission: EM | Admit: 2018-07-25 | Discharge: 2018-07-25 | Disposition: A | Discharge status: Home / Self Care | Payer: Medicare HMO | Attending: Emergency Medicine | Admitting: Emergency Medicine

## 2018-07-25 ENCOUNTER — Encounter (HOSPITAL_BASED_OUTPATIENT_CLINIC_OR_DEPARTMENT_OTHER): Payer: Self-pay | Admitting: *Deleted

## 2018-07-25 DIAGNOSIS — W01198A Fall on same level from slipping, tripping and stumbling with subsequent striking against other object, initial encounter: Secondary | ICD-10-CM | POA: Insufficient documentation

## 2018-07-25 DIAGNOSIS — Y93H9 Activity, other involving exterior property and land maintenance, building and construction: Secondary | ICD-10-CM | POA: Insufficient documentation

## 2018-07-25 DIAGNOSIS — S01511A Laceration without foreign body of lip, initial encounter: Secondary | ICD-10-CM | POA: Diagnosis not present

## 2018-07-25 DIAGNOSIS — Y92007 Garden or yard of unspecified non-institutional (private) residence as the place of occurrence of the external cause: Secondary | ICD-10-CM | POA: Diagnosis not present

## 2018-07-25 DIAGNOSIS — E039 Hypothyroidism, unspecified: Secondary | ICD-10-CM | POA: Diagnosis not present

## 2018-07-25 DIAGNOSIS — S0181XA Laceration without foreign body of other part of head, initial encounter: Secondary | ICD-10-CM

## 2018-07-25 DIAGNOSIS — Z7982 Long term (current) use of aspirin: Secondary | ICD-10-CM | POA: Insufficient documentation

## 2018-07-25 DIAGNOSIS — Z79899 Other long term (current) drug therapy: Secondary | ICD-10-CM | POA: Insufficient documentation

## 2018-07-25 DIAGNOSIS — Y999 Unspecified external cause status: Secondary | ICD-10-CM | POA: Insufficient documentation

## 2018-07-25 DIAGNOSIS — W19XXXA Unspecified fall, initial encounter: Secondary | ICD-10-CM

## 2018-07-25 DIAGNOSIS — R69 Illness, unspecified: Secondary | ICD-10-CM | POA: Diagnosis not present

## 2018-07-25 MED ORDER — LIDOCAINE HCL (PF) 1 % IJ SOLN
5.0000 mL | Freq: Once | INTRAMUSCULAR | Status: AC
  Administered 2018-07-25: 5 mL via INTRADERMAL
  Filled 2018-07-25: qty 5

## 2018-07-25 MED ORDER — ACETAMINOPHEN 325 MG PO TABS
650.0000 mg | ORAL_TABLET | Freq: Once | ORAL | Status: AC
  Administered 2018-07-25: 650 mg via ORAL
  Filled 2018-07-25: qty 2

## 2018-07-25 NOTE — ED Notes (Signed)
PT states understanding of care given, follow up care. PT ambulated from ED to car with a steady gait.  

## 2018-07-25 NOTE — ED Notes (Signed)
ED Provider at bedside. 

## 2018-07-25 NOTE — Discharge Instructions (Signed)
I have placed 3 sutures to the upper lip region, please have these removed within 5 days after placement. Please stay away from the sun.You may apply bacitracin or neosporin to the area.

## 2018-07-25 NOTE — ED Triage Notes (Signed)
She slipped and fell. Laceration above her upper lip. Bleeding controlled.

## 2018-07-25 NOTE — ED Provider Notes (Signed)
[facility] EMERGENCY DEPARTMENT Provider Note   CSN: [id number] Arrival date & time: 07/25/18  1938     History   Chief Complaint Chief Complaint  Patient presents with  . Fall  . Laceration    HPI Monique Ellis is a 73 y.o. female.  73 y.o female with a PMH of Barret's esophagus, GERD, Hyperlipidemia, Hypothyroid presents to the ED after a fall.  Patient reports that she was picking up trash from her front yard with a bag when she turned around and slipped on the concrete floor.  She reports falling by catching herself with her right hand, still managing to strike her face on the concrete.  Has a laceration to her upper lip right above the vermilion border, she has not taking anything for pain.  Patient was at the dentist this morning prior to fall and states she was anesthetized for dental procedure therefore her face was numb for an after her fall.  She endorses a slight headache.  She denies any LOC, dizziness, lightheaded, neck pain or other complaints.  Last tetanus shot was received in 2016.      Past Medical History:  Diagnosis Date  . Barrett's esophagus   . GERD (gastroesophageal reflux disease)   . Hyperlipidemia   . Hypothyroidism   . Seasonal allergies     Patient Active Problem List   Diagnosis Date Noted  . GERD (gastroesophageal reflux disease) 09/11/2011  . Gastritis 09/11/2011  . Barrett esophagus 08/31/2011  . Special screening for malignant neoplasms, colon 08/31/2011  . HYPOTHYROIDISM 07/29/2010  . GERD 07/06/2008  . HIATAL HERNIA 07/06/2008  . BARRETT'S ESOPHAGUS, HX OF 07/06/2008    Past Surgical History:  Procedure Laterality Date  . ABDOMINAL HYSTERECTOMY    . APPENDECTOMY  1954  . CARPAL TUNNEL RELEASE     bilateral  . NASAL SINUS SURGERY     x 3  . NEUROPLASTY / TRANSPOSITION MEDIAN NERVE AT CARPAL TUNNEL BILATERAL  2000  . ROTATOR CUFF REPAIR  2009   right  . TONSILLECTOMY  1980  . trigger thumb  2010   right     OB  History   No obstetric history on file.      Home Medications    Prior to Admission medications   Medication Sig Start Date End Date Taking? Authorizing Provider  aspirin 81 MG tablet Take 81 mg by mouth daily.   Yes [provider]  Cholecalciferol (VITAMIN D-3) 5000 UNITS TABS Take by mouth daily.   Yes [provider]  docusate sodium (COLACE) 250 MG capsule Take 250 mg by mouth 2 (two) times daily.   Yes [provider]  esomeprazole (NEXIUM) 40 MG capsule Take 1 capsule (40 mg total) by mouth 2 (two) times daily. 11/29/13 07/25/18 Yes Pyrtle, Lajuan Lines, MD  estradiol (VIVELLE-DOT) 0.025 MG/24HR Place 1 patch onto the skin 2 (two) times a week.   Yes [provider]  levothyroxine (SYNTHROID, LEVOTHROID) 75 MCG tablet Take 75 mcg by mouth daily.   Yes [provider]  pseudoephedrine (SUDAFED) 30 MG tablet Take 30 mg by mouth every 4 (four) hours as needed for congestion.   Yes [provider]  simvastatin (ZOCOR) 20 MG tablet Take 20 mg by mouth daily.   Yes [provider]  calcium carbonate (OS-CAL) 600 MG TABS Take 600 mg by mouth daily.    [provider]    Family History Family History  Problem Relation Age of Onset  .  Heart attack Father   . Heart disease Brother   . Heart disease Paternal Uncle   . Heart disease Paternal Grandfather   . Pancreatic cancer Maternal Aunt   . Breast cancer Maternal Aunt   . Colon cancer Neg Hx     Social History Social History   Tobacco Use  . Smoking status: Never Smoker  . Smokeless tobacco: Never Used  Substance Use Topics  . Alcohol use: Yes    Comment: occasional  . Drug use: No     Allergies   Patient has no known allergies.   Review of Systems Review of Systems  Respiratory: Negative for shortness of breath.   Cardiovascular: Negative for chest pain.  Skin: Positive for wound.  Neurological: Positive for headaches. Negative for syncope, weakness and  light-headedness.     Physical Exam Updated Vital Signs BP (!) 172/89 (BP Location: Right Arm)   Pulse 86   Temp 98 F (36.7 C) (Oral)   Resp 16   Ht 5\' 5"  (1.651 m)   Wt 68 kg   SpO2 98%   BMI 24.96 kg/m   Physical Exam Vitals signs and nursing note reviewed.  Constitutional:      General: She is not in acute distress.    Appearance: She is well-developed.  HENT:     Head: Normocephalic.     Mouth/Throat:     Dentition: No dental tenderness.     Tongue: No lesions. Tongue does not protrude in midline.     Pharynx: Oropharynx is clear. No oropharyngeal exudate.     Tonsils: No tonsillar exudate.      Comments: No penetration to the mucosa region.  Eyes:     Pupils: Pupils are equal, round, and reactive to light.  Neck:     Musculoskeletal: Normal range of motion.  Cardiovascular:     Rate and Rhythm: Regular rhythm.     Heart sounds: Normal heart sounds.  Pulmonary:     Effort: Pulmonary effort is normal. No respiratory distress.     Breath sounds: Normal breath sounds.  Abdominal:     General: Bowel sounds are normal. There is no distension.     Palpations: Abdomen is soft.     Tenderness: There is no abdominal tenderness.  Musculoskeletal:        General: No tenderness or deformity.     Right lower leg: No edema.     Left lower leg: No edema.  Skin:    General: Skin is warm and dry.  Neurological:     Mental Status: She is alert and oriented to person, place, and time.     Comments: Alert, oriented, thought content appropriate. Speech fluent without evidence of aphasia. Able to follow 2 step commands without difficulty.  Cranial Nerves:  II:  Peripheral visual fields grossly normal, pupils, round, reactive to light III,IV, VI: ptosis not present, extra-ocular motions intact bilaterally  V,VII: smile symmetric, facial light touch sensation equal VIII: hearing grossly normal bilaterally  IX,X: midline uvula rise  XI: bilateral shoulder shrug equal and  strong XII: midline tongue extension  Motor:  5/5 in upper and lower extremities bilaterally including strong and equal grip strength and dorsiflexion/plantar flexion Sensory: light touch normal in all extremities.  Cerebellar: normal finger-to-nose with bilateral upper extremities, pronator drift negative      ED Treatments / Results  Labs (all labs ordered are listed, but only abnormal results are displayed) Labs Reviewed - No data to display  EKG None  Radiology No results found.  Procedures .Marland KitchenLaceration Repair Date/Time: 07/25/2018 11:18 PM Performed by: Janeece Fitting, PA-C Authorized by: Janeece Fitting, PA-C   Consent:    Consent obtained:  Verbal   Consent given by:  Patient   Risks discussed:  Infection, pain and poor cosmetic result Anesthesia (see MAR for exact dosages):    Anesthesia method:  Local infiltration   Local anesthetic:  Lidocaine 1% w/o epi Laceration details:    Location:  Lip   Lip location:  Upper exterior lip   Length (cm):  1.5   Depth (mm):  0.5 Repair type:    Repair type:  Simple Exploration:    Hemostasis achieved with:  Direct pressure   Wound exploration: wound explored through full range of motion     Wound extent: no fascia violation noted   Treatment:    Area cleansed with:  Saline   Amount of cleaning:  Extensive   Irrigation solution:  Sterile saline Skin repair:    Repair method:  Sutures   Suture size:  6-0   Suture technique:  Simple interrupted   Number of sutures:  3 Approximation:    Approximation:  Close   Vermilion border: well-aligned   Post-procedure details:    Dressing:  Open (no dressing)   Patient tolerance of procedure:  Tolerated well, no immediate complications   (including critical care time)  Medications Ordered in ED Medications  acetaminophen (TYLENOL) tablet 650 mg (650 mg Oral Given 07/25/18 2158)  lidocaine (PF) (XYLOCAINE) 1 % injection 5 mL (5 mLs Intradermal Given 07/25/18 2159)      Initial Impression / Assessment and Plan / ED Course  I have reviewed the triage vital signs and the nursing notes.  Pertinent labs & imaging results that were available during my care of the patient were reviewed by me and considered in my medical decision making (see chart for details).    Presents with a fall after she was cleaning out garbage outside, states hitting her face against the concrete.  Is any loss of consciousness, blood thinner use.  Patient reports her face felt numb as she had been at the dentist earlier in the day and had been anesthetized for procedure.  During evaluation her neuro exam is unremarkable, she reports a slight headache but denies any dizziness, weakness, blurry vision.  Reports no midline tenderness around the neck area, she denies any LOC.  No imaging was obtained at this time as patient reports she braced her fall with her right hand.  Patient to the right hand, no surgical repair needed at this time.  There is a small 1.5 cm laceration to the upper lip, personally repaired this laceration and placed 3 sutures 6.0 Prolene.  Tolerated procedure without any complications.  At this time will have her follow-up with PCP for removal of her stitches in 5 days.  Patient's vitals stable during ED visit, ambulatory in the ED.  Patient stable for discharge.  Final Clinical Impressions(s) / ED Diagnoses   Final diagnoses:  Facial laceration, initial encounter  Fall, initial encounter    ED Discharge Orders    None       Janeece Fitting, PA-C 07/25/18 2327    Lennice Sites, DO 07/26/18 0101

## 2018-07-29 DIAGNOSIS — J069 Acute upper respiratory infection, unspecified: Secondary | ICD-10-CM | POA: Diagnosis not present

## 2018-07-29 DIAGNOSIS — S0181XA Laceration without foreign body of other part of head, initial encounter: Secondary | ICD-10-CM | POA: Diagnosis not present

## 2018-07-29 DIAGNOSIS — Z4802 Encounter for removal of sutures: Secondary | ICD-10-CM | POA: Diagnosis not present

## 2018-07-29 DIAGNOSIS — Z6826 Body mass index (BMI) 26.0-26.9, adult: Secondary | ICD-10-CM | POA: Diagnosis not present

## 2018-08-08 DIAGNOSIS — Z1231 Encounter for screening mammogram for malignant neoplasm of breast: Secondary | ICD-10-CM | POA: Diagnosis not present

## 2018-08-08 DIAGNOSIS — Z803 Family history of malignant neoplasm of breast: Secondary | ICD-10-CM | POA: Diagnosis not present

## 2018-08-22 DIAGNOSIS — L821 Other seborrheic keratosis: Secondary | ICD-10-CM | POA: Diagnosis not present

## 2018-08-22 DIAGNOSIS — L718 Other rosacea: Secondary | ICD-10-CM | POA: Diagnosis not present

## 2018-08-22 DIAGNOSIS — D2371 Other benign neoplasm of skin of right lower limb, including hip: Secondary | ICD-10-CM | POA: Diagnosis not present

## 2018-08-22 DIAGNOSIS — D235 Other benign neoplasm of skin of trunk: Secondary | ICD-10-CM | POA: Diagnosis not present

## 2018-08-22 DIAGNOSIS — L820 Inflamed seborrheic keratosis: Secondary | ICD-10-CM | POA: Diagnosis not present

## 2018-10-24 DIAGNOSIS — H43812 Vitreous degeneration, left eye: Secondary | ICD-10-CM | POA: Diagnosis not present

## 2018-12-08 DIAGNOSIS — H401134 Primary open-angle glaucoma, bilateral, indeterminate stage: Secondary | ICD-10-CM | POA: Diagnosis not present

## 2019-01-31 DIAGNOSIS — H401131 Primary open-angle glaucoma, bilateral, mild stage: Secondary | ICD-10-CM | POA: Diagnosis not present

## 2019-02-24 DIAGNOSIS — Z23 Encounter for immunization: Secondary | ICD-10-CM | POA: Diagnosis not present

## 2019-02-24 DIAGNOSIS — E7849 Other hyperlipidemia: Secondary | ICD-10-CM | POA: Diagnosis not present

## 2019-02-24 DIAGNOSIS — R7301 Impaired fasting glucose: Secondary | ICD-10-CM | POA: Diagnosis not present

## 2019-02-24 DIAGNOSIS — E038 Other specified hypothyroidism: Secondary | ICD-10-CM | POA: Diagnosis not present

## 2019-03-01 DIAGNOSIS — R82998 Other abnormal findings in urine: Secondary | ICD-10-CM | POA: Diagnosis not present

## 2019-03-22 DIAGNOSIS — Z1212 Encounter for screening for malignant neoplasm of rectum: Secondary | ICD-10-CM | POA: Diagnosis not present

## 2019-03-24 DIAGNOSIS — E039 Hypothyroidism, unspecified: Secondary | ICD-10-CM | POA: Diagnosis not present

## 2019-03-24 DIAGNOSIS — Z1331 Encounter for screening for depression: Secondary | ICD-10-CM | POA: Diagnosis not present

## 2019-03-24 DIAGNOSIS — M8580 Other specified disorders of bone density and structure, unspecified site: Secondary | ICD-10-CM | POA: Diagnosis not present

## 2019-03-24 DIAGNOSIS — M25562 Pain in left knee: Secondary | ICD-10-CM | POA: Diagnosis not present

## 2019-03-24 DIAGNOSIS — Z0000 Encounter for general adult medical examination without abnormal findings: Secondary | ICD-10-CM | POA: Diagnosis not present

## 2019-03-24 DIAGNOSIS — I10 Essential (primary) hypertension: Secondary | ICD-10-CM | POA: Diagnosis not present

## 2019-03-24 DIAGNOSIS — E785 Hyperlipidemia, unspecified: Secondary | ICD-10-CM | POA: Diagnosis not present

## 2019-03-24 DIAGNOSIS — K219 Gastro-esophageal reflux disease without esophagitis: Secondary | ICD-10-CM | POA: Diagnosis not present

## 2019-03-24 DIAGNOSIS — G4700 Insomnia, unspecified: Secondary | ICD-10-CM | POA: Diagnosis not present

## 2019-03-29 DIAGNOSIS — H04123 Dry eye syndrome of bilateral lacrimal glands: Secondary | ICD-10-CM | POA: Diagnosis not present

## 2019-03-29 DIAGNOSIS — H2513 Age-related nuclear cataract, bilateral: Secondary | ICD-10-CM | POA: Diagnosis not present

## 2019-03-29 DIAGNOSIS — H43812 Vitreous degeneration, left eye: Secondary | ICD-10-CM | POA: Diagnosis not present

## 2019-03-29 DIAGNOSIS — H40023 Open angle with borderline findings, high risk, bilateral: Secondary | ICD-10-CM | POA: Diagnosis not present

## 2019-05-29 DIAGNOSIS — H2513 Age-related nuclear cataract, bilateral: Secondary | ICD-10-CM | POA: Diagnosis not present

## 2019-05-29 DIAGNOSIS — H40023 Open angle with borderline findings, high risk, bilateral: Secondary | ICD-10-CM | POA: Diagnosis not present

## 2019-05-29 DIAGNOSIS — H04123 Dry eye syndrome of bilateral lacrimal glands: Secondary | ICD-10-CM | POA: Diagnosis not present

## 2019-06-15 DIAGNOSIS — Z01419 Encounter for gynecological examination (general) (routine) without abnormal findings: Secondary | ICD-10-CM | POA: Diagnosis not present

## 2019-06-15 DIAGNOSIS — Z6826 Body mass index (BMI) 26.0-26.9, adult: Secondary | ICD-10-CM | POA: Diagnosis not present

## 2019-06-15 DIAGNOSIS — Z124 Encounter for screening for malignant neoplasm of cervix: Secondary | ICD-10-CM | POA: Diagnosis not present

## 2019-06-15 DIAGNOSIS — Z90710 Acquired absence of both cervix and uterus: Secondary | ICD-10-CM | POA: Diagnosis not present

## 2019-06-15 DIAGNOSIS — Z1272 Encounter for screening for malignant neoplasm of vagina: Secondary | ICD-10-CM | POA: Diagnosis not present

## 2019-07-11 ENCOUNTER — Other Ambulatory Visit: Payer: Self-pay

## 2019-07-11 ENCOUNTER — Ambulatory Visit: Payer: Medicare HMO | Admitting: Podiatry

## 2019-07-11 ENCOUNTER — Ambulatory Visit (INDEPENDENT_AMBULATORY_CARE_PROVIDER_SITE_OTHER): Payer: Medicare HMO

## 2019-07-11 ENCOUNTER — Encounter: Payer: Self-pay | Admitting: Podiatry

## 2019-07-11 VITALS — BP 147/84 | HR 79 | Resp 16

## 2019-07-11 DIAGNOSIS — N951 Menopausal and female climacteric states: Secondary | ICD-10-CM | POA: Insufficient documentation

## 2019-07-11 DIAGNOSIS — M7751 Other enthesopathy of right foot: Secondary | ICD-10-CM | POA: Diagnosis not present

## 2019-07-11 DIAGNOSIS — Q828 Other specified congenital malformations of skin: Secondary | ICD-10-CM | POA: Diagnosis not present

## 2019-07-12 NOTE — Progress Notes (Signed)
Subjective:  Patient ID: Monique Ellis, female    DOB: 05/22/1946,  MRN: [id number] HPI Chief Complaint  Patient presents with  . Toe Pain    5th toe (medial) right - small callused area x several months, rubbing 4th toe   . Toe Pain    3rd toe right - medial border, intermittent redness and tenderness  . New Patient (Initial Visit)    74 y.o. female presents with the above complaint.   ROS: Denies fever chills nausea vomiting muscle aches pains calf pain back pain chest pain shortness of breath.  Past Medical History:  Diagnosis Date  . Barrett's esophagus   . GERD (gastroesophageal reflux disease)   . Hyperlipidemia   . Hypothyroidism   . Seasonal allergies    Past Surgical History:  Procedure Laterality Date  . ABDOMINAL HYSTERECTOMY    . APPENDECTOMY  1954  . CARPAL TUNNEL RELEASE     bilateral  . NASAL SINUS SURGERY     x 3  . NEUROPLASTY / TRANSPOSITION MEDIAN NERVE AT CARPAL TUNNEL BILATERAL  2000  . ROTATOR CUFF REPAIR  2009   right  . TONSILLECTOMY  1980  . trigger thumb  2010   right    Current Outpatient Medications:  .  aspirin 81 MG tablet, Take 81 mg by mouth daily., Disp: , Rfl:  .  calcium carbonate (OS-CAL) 600 MG TABS, Take 600 mg by mouth daily., Disp: , Rfl:  .  Cholecalciferol (VITAMIN D-3) 5000 UNITS TABS, Take by mouth daily., Disp: , Rfl:  .  docusate sodium (COLACE) 250 MG capsule, Take 250 mg by mouth 2 (two) times daily., Disp: , Rfl:  .  esomeprazole (NEXIUM) 40 MG capsule, Take 1 capsule (40 mg total) by mouth 2 (two) times daily., Disp: 180 capsule, Rfl: 3 .  estradiol (VIVELLE-DOT) 0.025 MG/24HR, Place 1 patch onto the skin 2 (two) times a week., Disp: , Rfl:  .  levothyroxine (SYNTHROID, LEVOTHROID) 75 MCG tablet, Take 75 mcg by mouth daily., Disp: , Rfl:  .  pseudoephedrine (SUDAFED) 30 MG tablet, Take 30 mg by mouth every 4 (four) hours as needed for congestion., Disp: , Rfl:  .  simvastatin (ZOCOR) 20 MG tablet, Take 20 mg by mouth  daily., Disp: , Rfl:   No Known Allergies Review of Systems Objective:   Vitals:   07/11/19 0835  BP: (!) 147/84  Pulse: 79  Resp: 16    General: Well developed, nourished, in no acute distress, alert and oriented x3   Dermatological: Skin is warm, dry and supple bilateral. Nails x 10 are well maintained; remaining integument appears unremarkable at this time. There are no open sores, no preulcerative lesions, no rash or signs of infection present.  Adductovarus rotated hammertoe deformity resulting in a hypertrophic area of skin to the medial aspect of the fifth toe small callused area has been there for about several months.  Vascular: Dorsalis Pedis artery and Posterior Tibial artery pedal pulses are 2/4 bilateral with immedate capillary fill time. Pedal hair growth present. No varicosities and no lower extremity edema present bilateral.   Neruologic: Grossly intact via light touch bilateral. Vibratory intact via tuning fork bilateral. Protective threshold with Semmes Wienstein monofilament intact to all pedal sites bilateral. Patellar and Achilles deep tendon reflexes 2+ bilateral. No Babinski or clonus noted bilateral.   Musculoskeletal: No gross boney pedal deformities bilateral. No pain, crepitus, or limitation noted with foot and ankle range of motion bilateral. Muscular strength 5/5 in all  groups tested bilateral.  Adductovarus rotated hammertoe deformity fifth right resulting in reactive hyperkeratotic lesion secondary to juxtaposition to the fourth toe.    Gait: Unassisted, Nonantalgic.    Radiographs:  Radiographs taken today demonstrate osseously mature individual mild adductovarus rotated hammertoe deformity.  No acute findings.  Assessment & Plan:   Assessment: Reactive hyperkeratotic lesion medial aspect fifth digit right mild paronychia third toe.  Plan: Debrided reactive hyperkeratotic tissue placed padding discussed the possible need for surgical intervention  follow-up with her as needed.     Alyiah Ulloa T. [place], [place]

## 2019-07-18 ENCOUNTER — Ambulatory Visit: Payer: Medicare Other | Attending: Internal Medicine

## 2019-07-18 DIAGNOSIS — Z23 Encounter for immunization: Secondary | ICD-10-CM | POA: Insufficient documentation

## 2019-07-18 NOTE — Progress Notes (Signed)
   Covid-19 Vaccination Clinic  Name:  Monique Ellis    MRN: [id number] DOB: 1945-12-20  07/18/2019  [name] was observed post Covid-19 immunization for 15 minutes without incidence. She was provided with Vaccine Information Sheet and instruction to access the V-Safe system.   [name] was instructed to call 911 with any severe reactions post vaccine: Marland Kitchen Difficulty breathing  . Swelling of your face and throat  . A fast heartbeat  . A bad rash all over your body  . Dizziness and weakness    Immunizations Administered    Name Date Dose VIS Date Route   Pfizer COVID-19 Vaccine 07/18/2019  5:13 PM 0.3 mL 06/09/2019 Intramuscular   Manufacturer: [place]   Lot: [id number]   [place]: [id number]

## 2019-07-26 DIAGNOSIS — L738 Other specified follicular disorders: Secondary | ICD-10-CM | POA: Diagnosis not present

## 2019-07-26 DIAGNOSIS — D235 Other benign neoplasm of skin of trunk: Secondary | ICD-10-CM | POA: Diagnosis not present

## 2019-07-26 DIAGNOSIS — L821 Other seborrheic keratosis: Secondary | ICD-10-CM | POA: Diagnosis not present

## 2019-07-26 DIAGNOSIS — D1801 Hemangioma of skin and subcutaneous tissue: Secondary | ICD-10-CM | POA: Diagnosis not present

## 2019-07-26 DIAGNOSIS — L438 Other lichen planus: Secondary | ICD-10-CM | POA: Diagnosis not present

## 2019-07-26 DIAGNOSIS — L820 Inflamed seborrheic keratosis: Secondary | ICD-10-CM | POA: Diagnosis not present

## 2019-08-07 ENCOUNTER — Ambulatory Visit: Payer: Medicare HMO | Attending: Internal Medicine

## 2019-08-07 DIAGNOSIS — Z23 Encounter for immunization: Secondary | ICD-10-CM | POA: Insufficient documentation

## 2019-08-07 NOTE — Progress Notes (Signed)
   Covid-19 Vaccination Clinic  Name:  Monique Ellis    MRN: [id number] DOB: Mar 27, 1946  08/07/2019  [name] was observed post Covid-19 immunization for 15 minutes without incidence. She was provided with Vaccine Information Sheet and instruction to access the V-Safe system.   [name] was instructed to call 911 with any severe reactions post vaccine: Marland Kitchen Difficulty breathing  . Swelling of your face and throat  . A fast heartbeat  . A bad rash all over your body  . Dizziness and weakness    Immunizations Administered    Name Date Dose VIS Date Route   Pfizer COVID-19 Vaccine 08/07/2019  8:25 AM 0.3 mL 06/09/2019 Intramuscular   Manufacturer: [place]   Lot: [id number]   [place]: [id number]

## 2019-08-14 DIAGNOSIS — Z1231 Encounter for screening mammogram for malignant neoplasm of breast: Secondary | ICD-10-CM | POA: Diagnosis not present

## 2019-08-14 DIAGNOSIS — M85852 Other specified disorders of bone density and structure, left thigh: Secondary | ICD-10-CM | POA: Diagnosis not present

## 2019-08-14 DIAGNOSIS — M85851 Other specified disorders of bone density and structure, right thigh: Secondary | ICD-10-CM | POA: Diagnosis not present

## 2019-08-15 ENCOUNTER — Ambulatory Visit: Payer: Medicare HMO

## 2019-08-29 DIAGNOSIS — R69 Illness, unspecified: Secondary | ICD-10-CM | POA: Diagnosis not present

## 2019-09-17 ENCOUNTER — Other Ambulatory Visit: Payer: Self-pay

## 2019-09-17 ENCOUNTER — Emergency Department (HOSPITAL_COMMUNITY): Payer: Medicare HMO

## 2019-09-17 ENCOUNTER — Inpatient Hospital Stay (HOSPITAL_COMMUNITY)
Admission: EM | Admit: 2019-09-17 | Discharge: 2019-09-20 | DRG: 378 | Disposition: A | Discharge status: Home / Self Care | Payer: Medicare HMO | Attending: Internal Medicine | Admitting: Internal Medicine

## 2019-09-17 ENCOUNTER — Encounter (HOSPITAL_COMMUNITY): Payer: Self-pay

## 2019-09-17 DIAGNOSIS — R935 Abnormal findings on diagnostic imaging of other abdominal regions, including retroperitoneum: Secondary | ICD-10-CM | POA: Diagnosis not present

## 2019-09-17 DIAGNOSIS — K529 Noninfective gastroenteritis and colitis, unspecified: Secondary | ICD-10-CM

## 2019-09-17 DIAGNOSIS — Z79890 Hormone replacement therapy (postmenopausal): Secondary | ICD-10-CM

## 2019-09-17 DIAGNOSIS — E785 Hyperlipidemia, unspecified: Secondary | ICD-10-CM

## 2019-09-17 DIAGNOSIS — Z20822 Contact with and (suspected) exposure to covid-19: Secondary | ICD-10-CM | POA: Diagnosis present

## 2019-09-17 DIAGNOSIS — K921 Melena: Secondary | ICD-10-CM | POA: Diagnosis not present

## 2019-09-17 DIAGNOSIS — K219 Gastro-esophageal reflux disease without esophagitis: Secondary | ICD-10-CM | POA: Diagnosis not present

## 2019-09-17 DIAGNOSIS — K449 Diaphragmatic hernia without obstruction or gangrene: Secondary | ICD-10-CM | POA: Diagnosis not present

## 2019-09-17 DIAGNOSIS — E039 Hypothyroidism, unspecified: Secondary | ICD-10-CM | POA: Diagnosis present

## 2019-09-17 DIAGNOSIS — K625 Hemorrhage of anus and rectum: Secondary | ICD-10-CM | POA: Diagnosis not present

## 2019-09-17 DIAGNOSIS — Z7982 Long term (current) use of aspirin: Secondary | ICD-10-CM | POA: Diagnosis not present

## 2019-09-17 DIAGNOSIS — D62 Acute posthemorrhagic anemia: Secondary | ICD-10-CM | POA: Diagnosis not present

## 2019-09-17 DIAGNOSIS — K922 Gastrointestinal hemorrhage, unspecified: Secondary | ICD-10-CM

## 2019-09-17 DIAGNOSIS — Z8249 Family history of ischemic heart disease and other diseases of the circulatory system: Secondary | ICD-10-CM

## 2019-09-17 DIAGNOSIS — K2970 Gastritis, unspecified, without bleeding: Secondary | ICD-10-CM | POA: Diagnosis present

## 2019-09-17 DIAGNOSIS — R1030 Lower abdominal pain, unspecified: Secondary | ICD-10-CM

## 2019-09-17 DIAGNOSIS — Z803 Family history of malignant neoplasm of breast: Secondary | ICD-10-CM

## 2019-09-17 DIAGNOSIS — K559 Vascular disorder of intestine, unspecified: Secondary | ICD-10-CM

## 2019-09-17 DIAGNOSIS — Z800 Family history of malignant neoplasm of digestive organs: Secondary | ICD-10-CM | POA: Diagnosis not present

## 2019-09-17 DIAGNOSIS — R933 Abnormal findings on diagnostic imaging of other parts of digestive tract: Secondary | ICD-10-CM | POA: Diagnosis not present

## 2019-09-17 DIAGNOSIS — K760 Fatty (change of) liver, not elsewhere classified: Secondary | ICD-10-CM

## 2019-09-17 LAB — CBC
HCT: 45 % (ref 36.0–46.0)
Hemoglobin: 14.8 g/dL (ref 12.0–15.0)
MCH: 31.2 pg (ref 26.0–34.0)
MCHC: 32.9 g/dL (ref 30.0–36.0)
MCV: 94.7 fL (ref 80.0–100.0)
Platelets: 281 10*3/uL (ref 150–400)
RBC: 4.75 MIL/uL (ref 3.87–5.11)
RDW: 12.5 % (ref 11.5–15.5)
WBC: 10.9 10*3/uL — ABNORMAL HIGH (ref 4.0–10.5)
nRBC: 0 % (ref 0.0–0.2)

## 2019-09-17 LAB — COMPREHENSIVE METABOLIC PANEL
ALT: 25 U/L (ref 0–44)
AST: 19 U/L (ref 15–41)
Albumin: 4.4 g/dL (ref 3.5–5.0)
Alkaline Phosphatase: 122 U/L (ref 38–126)
Anion gap: 11 (ref 5–15)
BUN: 28 mg/dL — ABNORMAL HIGH (ref 8–23)
CO2: 24 mmol/L (ref 22–32)
Calcium: 9.5 mg/dL (ref 8.9–10.3)
Chloride: 103 mmol/L (ref 98–111)
Creatinine, Ser: 0.93 mg/dL (ref 0.44–1.00)
GFR calc Af Amer: >60 mL/min (ref >60)
GFR calc non Af Amer: >60 mL/min (ref >60)
Glucose, Bld: 133 mg/dL — ABNORMAL HIGH (ref 70–99)
Potassium: 3.8 mmol/L (ref 3.5–5.1)
Sodium: 138 mmol/L (ref 135–145)
Total Bilirubin: 0.6 mg/dL (ref 0.3–1.2)
Total Protein: 7.5 g/dL (ref 6.5–8.1)

## 2019-09-17 LAB — LIPASE, BLOOD: Lipase: 20 U/L (ref 11–51)

## 2019-09-17 LAB — I-STAT CREATININE, ED: Creatinine, Ser: 0.8 mg/dL (ref 0.44–1.00)

## 2019-09-17 MED ORDER — SODIUM CHLORIDE 0.9% FLUSH
3.0000 mL | Freq: Once | INTRAVENOUS | Status: AC
  Administered 2019-09-17: 3 mL via INTRAVENOUS

## 2019-09-17 MED ORDER — SODIUM CHLORIDE (PF) 0.9 % IJ SOLN
INTRAMUSCULAR | Status: AC
  Filled 2019-09-17: qty 50

## 2019-09-17 MED ORDER — IOHEXOL 350 MG/ML SOLN
100.0000 mL | Freq: Once | INTRAVENOUS | Status: AC | PRN
  Administered 2019-09-17: 100 mL via INTRAVENOUS

## 2019-09-17 NOTE — ED Notes (Signed)
Family at bedside. 

## 2019-09-17 NOTE — ED Notes (Signed)
Patient aware urine sample is needed.  

## 2019-09-17 NOTE — ED Provider Notes (Signed)
[facility] DEPT Provider Note   CSN: [id number] Arrival date & time: 09/17/19  2130     History Chief Complaint  Patient presents with  . Abdominal Pain  . Melena    Monique Ellis is a 74 y.o. female.  Patient presents to the emergency department with a chief complaint of rectal bleeding.  She reports having 7-8 bloody bowel movements today.  She states they have been bright red blood.  She reports some mild lower abdominal discomfort.  She denies any fevers or chills.  She is not anticoagulated.  She has never had this problem before.  She reports having had colonoscopies in the past with no reported abnormal findings.  She denies any treatments prior to arrival.  Her GI is with [facility].  The history is provided by the patient. No language interpreter was used.       Past Medical History:  Diagnosis Date  . Barrett's esophagus   . GERD (gastroesophageal reflux disease)   . Hyperlipidemia   . Hypothyroidism   . Seasonal allergies     Patient Active Problem List   Diagnosis Date Noted  . Menopausal syndrome 07/11/2019  . GERD (gastroesophageal reflux disease) 09/11/2011  . Gastritis 09/11/2011  . Barrett esophagus 08/31/2011  . Special screening for malignant neoplasms, colon 08/31/2011  . HYPOTHYROIDISM 07/29/2010  . GERD 07/06/2008  . HIATAL HERNIA 07/06/2008  . BARRETT'S ESOPHAGUS, HX OF 07/06/2008    Past Surgical History:  Procedure Laterality Date  . ABDOMINAL HYSTERECTOMY    . APPENDECTOMY  1954  . CARPAL TUNNEL RELEASE     bilateral  . NASAL SINUS SURGERY     x 3  . NEUROPLASTY / TRANSPOSITION MEDIAN NERVE AT CARPAL TUNNEL BILATERAL  2000  . ROTATOR CUFF REPAIR  2009   right  . TONSILLECTOMY  1980  . trigger thumb  2010   right     OB History   No obstetric history on file.     Family History  Problem Relation Age of Onset  . Heart attack Father   . Heart disease Brother   . Heart disease Paternal Uncle   .  Heart disease Paternal Grandfather   . Pancreatic cancer Maternal Aunt   . Breast cancer Maternal Aunt   . Colon cancer Neg Hx     Social History   Tobacco Use  . Smoking status: Never Smoker  . Smokeless tobacco: Never Used  Substance Use Topics  . Alcohol use: Yes    Comment: occasional  . Drug use: No    Home Medications Prior to Admission medications   Medication Sig Start Date End Date Taking? Authorizing Provider  aspirin 81 MG tablet Take 81 mg by mouth daily.    [provider]  calcium carbonate (OS-CAL) 600 MG TABS Take 600 mg by mouth daily.    [provider]  Cholecalciferol (VITAMIN D-3) 5000 UNITS TABS Take by mouth daily.    [provider]  docusate sodium (COLACE) 250 MG capsule Take 250 mg by mouth 2 (two) times daily.    [provider]  esomeprazole (NEXIUM) 40 MG capsule Take 1 capsule (40 mg total) by mouth 2 (two) times daily. 11/29/13 07/25/18  Pyrtle, Lajuan Lines, MD  estradiol (VIVELLE-DOT) 0.025 MG/24HR Place 1 patch onto the skin 2 (two) times a week.    [provider]  levothyroxine (SYNTHROID, LEVOTHROID) 75 MCG tablet Take 75 mcg by mouth daily.    [provider]  pseudoephedrine (SUDAFED) 30 MG tablet Take 30 mg by mouth every 4 (four) hours as needed for congestion.    [provider]  simvastatin (ZOCOR) 20 MG tablet Take 20 mg by mouth daily.    [provider]    Allergies    Patient has no known allergies.  Review of Systems   Review of Systems  All other systems reviewed and are negative.   Physical Exam Updated Vital Signs BP (!) 156/70   Pulse 95   Temp 98.3 F (36.8 C) (Oral)   Resp 18   SpO2 99%   Physical Exam Vitals and nursing note reviewed.  Constitutional:      General: She is not in acute distress.    Appearance: She is well-developed.  HENT:     Head: Normocephalic and atraumatic.  Eyes:     Conjunctiva/sclera: Conjunctivae normal.    Cardiovascular:     Rate and Rhythm: Normal rate and regular rhythm.     Heart sounds: No murmur.  Pulmonary:     Effort: Pulmonary effort is normal. No respiratory distress.     Breath sounds: Normal breath sounds.  Abdominal:     Palpations: Abdomen is soft.     Tenderness: There is no abdominal tenderness.     Comments: Mild lower abdominal discomfort  Musculoskeletal:     Cervical back: Neck supple.  Skin:    General: Skin is warm and dry.  Neurological:     Mental Status: She is alert and oriented to person, place, and time.  Psychiatric:        Mood and Affect: Mood normal.        Behavior: Behavior normal.     ED Results / Procedures / Treatments   Labs (all labs ordered are listed, but only abnormal results are displayed) Labs Reviewed  COMPREHENSIVE METABOLIC PANEL - Abnormal; Notable for the following components:      Result Value   Glucose, Bld 133 (*)    BUN 28 (*)    All other components within normal limits  CBC - Abnormal; Notable for the following components:   WBC 10.9 (*)    All other components within normal limits  LIPASE, BLOOD  URINALYSIS, ROUTINE W REFLEX MICROSCOPIC  I-STAT CREATININE, ED  POC OCCULT BLOOD, ED  TYPE AND SCREEN    EKG None  Radiology CT Angio Abd/Pel W and/or Wo Contrast  Result Date: 09/17/2019 CLINICAL DATA:  Bright red blood per rectum. EXAM: CTA ABDOMEN AND PELVIS WITHOUT AND WITH CONTRAST TECHNIQUE: Multidetector CT imaging of the abdomen and pelvis was performed using the standard protocol during bolus administration of intravenous contrast. Multiplanar reconstructed images and MIPs were obtained and reviewed to evaluate the vascular anatomy. CONTRAST:  161mL OMNIPAQUE IOHEXOL 350 MG/ML SOLN COMPARISON:  None. FINDINGS: VASCULAR Aorta: Normal caliber aorta without aneurysm, dissection, vasculitis or significant stenosis. Celiac: There is mild narrowing at the origin of the celiac axis. There is normal hepatic arterial  anatomy. A prominent supraduodenal artery is noted. SMA: Patent without evidence of aneurysm, dissection, vasculitis or significant stenosis. Renals: Both renal arteries are patent without evidence of aneurysm, dissection, vasculitis, fibromuscular dysplasia or significant stenosis. IMA: Patent without evidence of aneurysm, dissection, vasculitis or significant stenosis. Inflow: Patent without evidence of aneurysm, dissection, vasculitis or significant stenosis. Proximal Outflow: Bilateral common femoral and visualized portions of the superficial and profunda femoral arteries are patent without evidence of aneurysm, dissection, vasculitis or significant stenosis. Veins: No obvious venous abnormality within  the limitations of this arterial phase study. Review of the MIP images confirms the above findings. NON-VASCULAR Lower chest: The lung bases are clear. The heart size is normal. Hepatobiliary: There is decreased hepatic attenuation suggestive of hepatic steatosis. Normal gallbladder.There is no biliary ductal dilation. Pancreas: Normal contours without ductal dilatation. No peripancreatic fluid collection. Spleen: No splenic laceration or hematoma. Adrenals/Urinary Tract: --Adrenal glands: No adrenal hemorrhage. --Right kidney/ureter: No hydronephrosis or perinephric hematoma. --Left kidney/ureter: No hydronephrosis or perinephric hematoma. --Urinary bladder: Unremarkable. Stomach/Bowel: --Stomach/Duodenum: No hiatal hernia or other gastric abnormality. Normal duodenal course and caliber. --Small bowel: No dilatation or inflammation. --Colon: There is diffuse colonic wall thickening involving the transverse, descending, sigmoid colon and rectum. There is no evidence for active arterial extravasation. --Appendix: Normal. Lymphatic: --No retroperitoneal lymphadenopathy. --No mesenteric lymphadenopathy. --No pelvic or inguinal lymphadenopathy. Reproductive: Status post hysterectomy. No adnexal mass. Other: There is a  small amount of free fluid in the patient's pelvis, likely reactive. The abdominal wall is normal. Musculoskeletal. No acute displaced fractures. IMPRESSION: 1. Diffuse colonic wall thickening as detailed above favored to be secondary to infectious or inflammatory colitis. There is no evidence for active contrast extravasation into the bowel. There is no bowel obstruction. 2. Small amount of free fluid in the patient's pelvis, likely reactive. 3. Hepatic steatosis. Aortic Atherosclerosis (ICD10-I70.0). Electronically Signed   By: Constance Holster M.D.   On: 09/17/2019 23:53    Procedures Procedures (including critical care time)  Medications Ordered in ED Medications  sodium chloride flush (NS) 0.9 % injection 3 mL (3 mLs Intravenous Given 09/17/19 2200)    ED Course  I have reviewed the triage vital signs and the nursing notes.  Pertinent labs & imaging results that were available during my care of the patient were reviewed by me and considered in my medical decision making (see chart for details).    MDM Rules/Calculators/A&P                      Patient with lower abdominal pain and GI bleeding.  She states that she has had several bright red bloody bowel movements today.  She also reports some lower abdominal cramping.  She is followed by [facility], but denies any history of abnormal colonoscopy.  She is not anticoagulated.  CT angio abdomen ordered per GI protocol to assess for any evidence of diverticular bleeding.  CT did not reveal any evidence of diverticular bleeding.  There was some bowel wall inflammation consistent with either infectious or inflammatory colitis.  Patient does have mildly elevated leukocytosis, but is afebrile.  I will cover the patient with antibiotic.  Case discussed with Dr. Marice Potter, who is appreciated for bringing patient into the hospital.   Final Clinical Impression(s) / ED Diagnoses Final diagnoses:  Acute GI bleeding  Colitis    Rx / DC Orders ED  Discharge Orders    None       Montine Circle, PA-C 09/18/19 [id number]    Sherwood Gambler, MD 09/19/19 [phone]

## 2019-09-17 NOTE — ED Triage Notes (Signed)
PT c/o abdominal pain and lower abdominal spasms starting this morning. Pt states she has had bright red blood in her stool today.

## 2019-09-17 NOTE — ED Notes (Signed)
Ice chips given

## 2019-09-18 ENCOUNTER — Other Ambulatory Visit: Payer: Self-pay

## 2019-09-18 DIAGNOSIS — Z800 Family history of malignant neoplasm of digestive organs: Secondary | ICD-10-CM | POA: Diagnosis not present

## 2019-09-18 DIAGNOSIS — R935 Abnormal findings on diagnostic imaging of other abdominal regions, including retroperitoneum: Secondary | ICD-10-CM | POA: Diagnosis not present

## 2019-09-18 DIAGNOSIS — Z8249 Family history of ischemic heart disease and other diseases of the circulatory system: Secondary | ICD-10-CM | POA: Diagnosis not present

## 2019-09-18 DIAGNOSIS — R933 Abnormal findings on diagnostic imaging of other parts of digestive tract: Secondary | ICD-10-CM | POA: Diagnosis not present

## 2019-09-18 DIAGNOSIS — K559 Vascular disorder of intestine, unspecified: Secondary | ICD-10-CM | POA: Diagnosis not present

## 2019-09-18 DIAGNOSIS — Z803 Family history of malignant neoplasm of breast: Secondary | ICD-10-CM | POA: Diagnosis not present

## 2019-09-18 DIAGNOSIS — K449 Diaphragmatic hernia without obstruction or gangrene: Secondary | ICD-10-CM | POA: Diagnosis not present

## 2019-09-18 DIAGNOSIS — K219 Gastro-esophageal reflux disease without esophagitis: Secondary | ICD-10-CM | POA: Diagnosis not present

## 2019-09-18 DIAGNOSIS — D62 Acute posthemorrhagic anemia: Secondary | ICD-10-CM | POA: Diagnosis not present

## 2019-09-18 DIAGNOSIS — E039 Hypothyroidism, unspecified: Secondary | ICD-10-CM | POA: Diagnosis not present

## 2019-09-18 DIAGNOSIS — E785 Hyperlipidemia, unspecified: Secondary | ICD-10-CM

## 2019-09-18 DIAGNOSIS — K921 Melena: Secondary | ICD-10-CM | POA: Diagnosis not present

## 2019-09-18 DIAGNOSIS — K760 Fatty (change of) liver, not elsewhere classified: Secondary | ICD-10-CM | POA: Diagnosis present

## 2019-09-18 DIAGNOSIS — R1030 Lower abdominal pain, unspecified: Secondary | ICD-10-CM | POA: Diagnosis not present

## 2019-09-18 DIAGNOSIS — K2970 Gastritis, unspecified, without bleeding: Secondary | ICD-10-CM | POA: Diagnosis present

## 2019-09-18 DIAGNOSIS — Z20822 Contact with and (suspected) exposure to covid-19: Secondary | ICD-10-CM | POA: Diagnosis not present

## 2019-09-18 DIAGNOSIS — Z79890 Hormone replacement therapy (postmenopausal): Secondary | ICD-10-CM | POA: Diagnosis not present

## 2019-09-18 DIAGNOSIS — Z7982 Long term (current) use of aspirin: Secondary | ICD-10-CM | POA: Diagnosis not present

## 2019-09-18 LAB — CBC
HCT: 39.3 % (ref 36.0–46.0)
HCT: 37.5 % (ref 36.0–46.0)
HCT: 38.8 % (ref 36.0–46.0)
Hemoglobin: 12.9 g/dL (ref 12.0–15.0)
Hemoglobin: 12.5 g/dL (ref 12.0–15.0)
Hemoglobin: 12.7 g/dL (ref 12.0–15.0)
MCH: 30.7 pg (ref 26.0–34.0)
MCH: 31.8 pg (ref 26.0–34.0)
MCH: 31.2 pg (ref 26.0–34.0)
MCHC: 32.8 g/dL (ref 30.0–36.0)
MCHC: 33.3 g/dL (ref 30.0–36.0)
MCHC: 32.7 g/dL (ref 30.0–36.0)
MCV: 93.6 fL (ref 80.0–100.0)
MCV: 95.4 fL (ref 80.0–100.0)
MCV: 95.3 fL (ref 80.0–100.0)
Platelets: 243 10*3/uL (ref 150–400)
Platelets: 198 10*3/uL (ref 150–400)
Platelets: 219 10*3/uL (ref 150–400)
RBC: 4.2 MIL/uL (ref 3.87–5.11)
RBC: 3.93 MIL/uL (ref 3.87–5.11)
RBC: 4.07 MIL/uL (ref 3.87–5.11)
RDW: 12.4 % (ref 11.5–15.5)
RDW: 12.4 % (ref 11.5–15.5)
RDW: 12.5 % (ref 11.5–15.5)
WBC: 9.2 10*3/uL (ref 4.0–10.5)
WBC: 8.5 10*3/uL (ref 4.0–10.5)
WBC: 9 10*3/uL (ref 4.0–10.5)
nRBC: 0 % (ref 0.0–0.2)
nRBC: 0 % (ref 0.0–0.2)
nRBC: 0 % (ref 0.0–0.2)

## 2019-09-18 LAB — SARS CORONAVIRUS 2 (TAT 6-24 HRS): SARS Coronavirus 2: NEGATIVE

## 2019-09-18 LAB — URINALYSIS, ROUTINE W REFLEX MICROSCOPIC
Bilirubin Urine: NEGATIVE
Glucose, UA: NEGATIVE mg/dL
Hgb urine dipstick: NEGATIVE
Leukocytes,Ua: NEGATIVE
Nitrite: NEGATIVE
Protein, ur: NEGATIVE mg/dL
Specific Gravity, Urine: <1.005 — ABNORMAL LOW (ref 1.005–1.030)
pH: 5.5 (ref 5.0–8.0)

## 2019-09-18 LAB — ABO/RH: ABO/RH(D): O POS

## 2019-09-18 LAB — PREPARE RBC (CROSSMATCH)

## 2019-09-18 LAB — OCCULT BLOOD X 1 CARD TO LAB, STOOL: Fecal Occult Bld: POSITIVE — AB

## 2019-09-18 MED ORDER — METRONIDAZOLE IN NACL 5-0.79 MG/ML-% IV SOLN
500.0000 mg | Freq: Three times a day (TID) | INTRAVENOUS | Status: AC
  Administered 2019-09-18: 500 mg via INTRAVENOUS
  Filled 2019-09-18: qty 100

## 2019-09-18 MED ORDER — SIMVASTATIN 40 MG PO TABS
40.0000 mg | ORAL_TABLET | Freq: Every day | ORAL | Status: DC
  Administered 2019-09-18 – 2019-09-19 (×2): 40 mg via ORAL
  Filled 2019-09-18 (×2): qty 1

## 2019-09-18 MED ORDER — PEG-KCL-NACL-NASULF-NA ASC-C 100 G PO SOLR: 0.5000 | Freq: Once | ORAL | Status: DC

## 2019-09-18 MED ORDER — CIPROFLOXACIN IN D5W 400 MG/200ML IV SOLN
400.0000 mg | Freq: Once | INTRAVENOUS | Status: AC
  Administered 2019-09-18: 01:00:00 400 mg via INTRAVENOUS
  Filled 2019-09-18: qty 200

## 2019-09-18 MED ORDER — PEG-KCL-NACL-NASULF-NA ASC-C 100 G PO SOLR: 1.0000 | Freq: Once | ORAL | Status: DC

## 2019-09-18 MED ORDER — SODIUM CHLORIDE 0.9% IV SOLUTION: Freq: Once | INTRAVENOUS | Status: DC

## 2019-09-18 MED ORDER — PEG-KCL-NACL-NASULF-NA ASC-C 100 G PO SOLR
0.5000 | Freq: Once | ORAL | Status: DC
  Filled 2019-09-18: qty 1

## 2019-09-18 MED ORDER — METRONIDAZOLE IN NACL 5-0.79 MG/ML-% IV SOLN
500.0000 mg | Freq: Once | INTRAVENOUS | Status: AC
  Administered 2019-09-18: 500 mg via INTRAVENOUS
  Filled 2019-09-18: qty 100

## 2019-09-18 MED ORDER — PANTOPRAZOLE SODIUM 40 MG PO TBEC
40.0000 mg | DELAYED_RELEASE_TABLET | Freq: Every day | ORAL | Status: DC
  Administered 2019-09-18 – 2019-09-20 (×3): 40 mg via ORAL
  Filled 2019-09-18 (×3): qty 1

## 2019-09-18 MED ORDER — CIPROFLOXACIN IN D5W 400 MG/200ML IV SOLN
400.0000 mg | Freq: Two times a day (BID) | INTRAVENOUS | Status: DC
  Administered 2019-09-18 – 2019-09-19 (×4): 400 mg via INTRAVENOUS
  Filled 2019-09-18 (×5): qty 200

## 2019-09-18 MED ORDER — LEVOTHYROXINE SODIUM 75 MCG PO TABS
75.0000 ug | ORAL_TABLET | Freq: Every day | ORAL | Status: DC
  Administered 2019-09-19 – 2019-09-20 (×2): 75 ug via ORAL
  Filled 2019-09-18 (×2): qty 1

## 2019-09-18 NOTE — Consult Note (Addendum)
Referring Provider:  [facility]         Primary Care Physician:  Velna Hatchet, MD Primary Gastroenterologist:  Zenovia Jarred, MD        We were asked to see this patient for:  Hematochezia / abnormal CT scan                 ASSESSMENT /  PLAN   74 yo female with pmh significant for hypothyroidism, GERD, ? Barrett's esophagus, hyperlipidemia  # lower abdominal pain / hematochezia / colon wall thickening involving transverse and left colon / rectum.  --Ischemic colitis possible though interesting there isn't rectal sparing. Does take estrogen. Also some recent NSAID use. No recent decongestants / pseudoephedrine / migraine medications. Patent SMA, IMA on CT angio --Infectious etiology seems less likely and IBD even more unlikely.  --Continue supportive care.  --Antibiotics probably not needed at this point --Will need colonoscopy at some point. Will give her clears for now.      HPI:    Chief Complaint: rectal bleeding  Monique Ellis is a 74 y.o. female who presented to the ED last night for abdominal pain and rectal bleeding.  She was hemodynamically stable.  Hemoglobin baseline (2012) was 13.  She presented yesterday at 14.8, down to 12.9 today.  WBC 10.9 . CT scan of the abdomen and pelvis with and without contrast shows hepatic steatosis, diffuse colonic wall thickening involving the transverse, descending, sigmoid colon and rectum likely secondary to infectious or inflammatory colitis.  Monique Ellis went to bed feeling fine on Saturday evening. She woke up around 3 am Sunday with severe lower abdominal cramping. Thought she had to have a BM but couldn't pass anything for a while. Finally had two loose stools. Later she began having multiple episodes of hematochezia.     Previous GI evaluations: Screening colonoscopy March 2013. Complete exam, excellent prep. -Normal.   EGD for h/o Barrett's esophagus.  -mild gastritis. GEJ biopsied , ?? Short -segment -No H.pylori on stain. -Of  note NO GEJ mucosa present on biopsy   MILDLY INFLAMED GASTRIC TYPE MUCOSA. - THERE IS NO EVIDENCE OF GOBLET CELL METAPLASIA, DYSPLASIA, OR MALIGNANCY.   Past Medical History:  Diagnosis Date  . Barrett's esophagus   . GERD (gastroesophageal reflux disease)   . Hyperlipidemia   . Hypothyroidism   . Seasonal allergies     Past Surgical History:  Procedure Laterality Date  . ABDOMINAL HYSTERECTOMY    . APPENDECTOMY  1954  . CARPAL TUNNEL RELEASE     bilateral  . NASAL SINUS SURGERY     x 3  . NEUROPLASTY / TRANSPOSITION MEDIAN NERVE AT CARPAL TUNNEL BILATERAL  2000  . ROTATOR CUFF REPAIR  2009   right  . TONSILLECTOMY  1980  . trigger thumb  2010   right    Prior to Admission medications   Medication Sig Start Date End Date Taking? Authorizing Provider  aspirin 81 MG tablet Take 81 mg by mouth daily.   Yes [provider]  Cholecalciferol (VITAMIN D-3) 5000 UNITS TABS Take by mouth daily.   Yes [provider]  docusate sodium (COLACE) 250 MG capsule Take 250 mg by mouth 2 (two) times daily.   Yes [provider]  esomeprazole (NEXIUM) 40 MG capsule Take 1 capsule (40 mg total) by mouth 2 (two) times daily. 11/29/13 09/18/19 Yes Pyrtle, Lajuan Lines, MD  estradiol (VIVELLE-DOT) 0.025 MG/24HR Place 1 patch onto the skin 2 (two) times a week.  Yes [provider]  fexofenadine (ALLEGRA) 180 MG tablet Take 180 mg by mouth daily.   Yes [provider]  levothyroxine (SYNTHROID, LEVOTHROID) 75 MCG tablet Take 75 mcg by mouth daily.   Yes [provider]  loratadine (CLARITIN) 10 MG tablet Take 10 mg by mouth daily as needed for allergies.   Yes [provider]  simvastatin (ZOCOR) 40 MG tablet Take 40 mg by mouth daily.   Yes [provider]    Current Facility-Administered Medications  Medication Dose Route Frequency Provider Last Rate Last Admin  . 0.9 %  sodium chloride infusion (Manually program via Guardrails  IV Fluids)   Intravenous Once Fair, Chelsea N, MD      . ciprofloxacin (CIPRO) IVPB 400 mg  400 mg Intravenous Q12H Fair, Chelsea N, MD      . levothyroxine (SYNTHROID) tablet 75 mcg  75 mcg Oral Daily Fair, Chelsea N, MD      . pantoprazole (PROTONIX) EC tablet 40 mg  40 mg Oral Daily Fair, Marin Shutter, MD   40 mg at 09/18/19 0852  . simvastatin (ZOCOR) tablet 40 mg  40 mg Oral q1800 Chauncey Mann, MD        Allergies as of 09/17/2019  . (No Known Allergies)    Family History  Problem Relation Age of Onset  . Heart attack Father   . Heart disease Brother   . Heart disease Paternal Uncle   . Heart disease Paternal Grandfather   . Pancreatic cancer Maternal Aunt   . Breast cancer Maternal Aunt   . Colon cancer Neg Hx     Social History   Socioeconomic History  . Marital status: Divorced    Spouse name: Not on file  . Number of children: Not on file  . Years of education: Not on file  . Highest education level: Not on file  Occupational History  . Not on file  Tobacco Use  . Smoking status: Never Smoker  . Smokeless tobacco: Never Used  Substance and Sexual Activity  . Alcohol use: Yes    Comment: occasional  . Drug use: No  . Sexual activity: Not on file  Other Topics Concern  . Not on file  Social History Narrative  . Not on file   Social Determinants of Health   Financial Resource Strain:   . Difficulty of Paying Living Expenses:   Food Insecurity:   . Worried About Charity fundraiser in the Last Year:   . Arboriculturist in the Last Year:   Transportation Needs:   . Film/video editor (Medical):   Marland Kitchen Lack of Transportation (Non-Medical):   Physical Activity:   . Days of Exercise per Week:   . Minutes of Exercise per Session:   Stress:   . Feeling of Stress :   Social Connections:   . Frequency of Communication with Friends and Family:   . Frequency of Social Gatherings with Friends and Family:   . Attends Religious Services:   . Active Member of  Clubs or Organizations:   . Attends Archivist Meetings:   Marland Kitchen Marital Status:   Intimate Partner Violence:   . Fear of Current or Ex-Partner:   . Emotionally Abused:   Marland Kitchen Physically Abused:   . Sexually Abused:     Review of Systems: All systems reviewed and negative except where noted in HPI.  Physical Exam: Vital signs in last 24 hours: Temp:  [97.6 F (36.4  C)-98.3 F (36.8 C)] 97.6 F (36.4 C) (03/22 0858) Pulse Rate:  [73-95] 81 (03/22 0858) Resp:  [14-18] 15 (03/22 0858) BP: (143-156)/(67-87) 143/68 (03/22 0858) SpO2:  [96 %-100 %] 97 % (03/22 0858) Weight:  [67.1 kg] 67.1 kg (03/22 0306) Last BM Date: 09/18/19 General:   Alert, well-developed,  female in NAD Psych:  Pleasant, cooperative. Normal mood and affect. Eyes:  Pupils equal, sclera clear, no icterus.   Conjunctiva pink. Ears:  Normal auditory acuity. Nose:  No deformity, discharge,  or lesions. Neck:  Supple; no masses Lungs:  Clear throughout to auscultation.   No wheezes, crackles, or rhonchi.  Heart:  Regular rate and rhythm; no murmurs, no lower extremity edema Abdomen:  Soft, non-distended, moderate LLQ tenderness, BS active, no palp mass   Rectal:  Deferred  Msk:  Symmetrical without gross deformities. . Neurologic:  Alert and  oriented x4;  grossly normal neurologically. Skin:  Intact without significant lesions or rashes.   Intake/Output from previous day: 03/21 0701 - 03/22 0700 In: 400 [IV Piggyback:400] Out: -  Intake/Output this shift: Total I/O In: 60 [P.O.:60] Out: -   Lab Results: Recent Labs    09/17/19 2201 09/18/19 0526  WBC 10.9* 9.2  HGB 14.8 12.9  HCT 45.0 39.3  PLT 281 243   BMET Recent Labs    09/17/19 2201 09/17/19 2307  NA 138  --   K 3.8  --   CL 103  --   CO2 24  --   GLUCOSE 133*  --   BUN 28*  --   CREATININE 0.93 0.80  CALCIUM 9.5  --    LFT Recent Labs    09/17/19 2201  PROT 7.5  ALBUMIN 4.4  AST 19  ALT 25  ALKPHOS 122  BILITOT 0.6    PT/INR No results for input(s): LABPROT, INR in the last 72 hours. Hepatitis Panel No results for input(s): HEPBSAG, HCVAB, HEPAIGM, HEPBIGM in the last 72 hours.   . CBC Latest Ref Rng & Units 09/18/2019 09/17/2019 07/29/2010  WBC 4.0 - 10.5 K/uL 9.2 10.9(H) 6.1  Hemoglobin 12.0 - 15.0 g/dL 12.9 14.8 13.0  Hematocrit 36.0 - 46.0 % 39.3 45.0 37.7  Platelets 150 - 400 K/uL 243 281 231.0    . CMP Latest Ref Rng & Units 09/17/2019 09/17/2019 07/29/2010  Glucose 70 - 99 mg/dL - 133(H) 80  BUN 8 - 23 mg/dL - 28(H) 14  Creatinine 0.44 - 1.00 mg/dL 0.80 0.93 0.7  Sodium 135 - 145 mmol/L - 138 143  Potassium 3.5 - 5.1 mmol/L - 3.8 3.9  Chloride 98 - 111 mmol/L - 103 105  CO2 22 - 32 mmol/L - 24 33(H)  Calcium 8.9 - 10.3 mg/dL - 9.5 9.0  Total Protein 6.5 - 8.1 g/dL - 7.5 6.4  Total Bilirubin 0.3 - 1.2 mg/dL - 0.6 0.2(L)  Alkaline Phos 38 - 126 U/L - 122 138(H)  AST 15 - 41 U/L - 19 25  ALT 0 - 44 U/L - 25 36(H)   Studies/Results: CT Angio Abd/Pel W and/or Wo Contrast  Result Date: 09/17/2019 CLINICAL DATA:  Bright red blood per rectum. EXAM: CTA ABDOMEN AND PELVIS WITHOUT AND WITH CONTRAST TECHNIQUE: Multidetector CT imaging of the abdomen and pelvis was performed using the standard protocol during bolus administration of intravenous contrast. Multiplanar reconstructed images and MIPs were obtained and reviewed to evaluate the vascular anatomy. CONTRAST:  143mL OMNIPAQUE IOHEXOL 350 MG/ML SOLN COMPARISON:  None. FINDINGS: VASCULAR Aorta:  Normal caliber aorta without aneurysm, dissection, vasculitis or significant stenosis. Celiac: There is mild narrowing at the origin of the celiac axis. There is normal hepatic arterial anatomy. A prominent supraduodenal artery is noted. SMA: Patent without evidence of aneurysm, dissection, vasculitis or significant stenosis. Renals: Both renal arteries are patent without evidence of aneurysm, dissection, vasculitis, fibromuscular dysplasia or significant  stenosis. IMA: Patent without evidence of aneurysm, dissection, vasculitis or significant stenosis. Inflow: Patent without evidence of aneurysm, dissection, vasculitis or significant stenosis. Proximal Outflow: Bilateral common femoral and visualized portions of the superficial and profunda femoral arteries are patent without evidence of aneurysm, dissection, vasculitis or significant stenosis. Veins: No obvious venous abnormality within the limitations of this arterial phase study. Review of the MIP images confirms the above findings. NON-VASCULAR Lower chest: The lung bases are clear. The heart size is normal. Hepatobiliary: There is decreased hepatic attenuation suggestive of hepatic steatosis. Normal gallbladder.There is no biliary ductal dilation. Pancreas: Normal contours without ductal dilatation. No peripancreatic fluid collection. Spleen: No splenic laceration or hematoma. Adrenals/Urinary Tract: --Adrenal glands: No adrenal hemorrhage. --Right kidney/ureter: No hydronephrosis or perinephric hematoma. --Left kidney/ureter: No hydronephrosis or perinephric hematoma. --Urinary bladder: Unremarkable. Stomach/Bowel: --Stomach/Duodenum: No hiatal hernia or other gastric abnormality. Normal duodenal course and caliber. --Small bowel: No dilatation or inflammation. --Colon: There is diffuse colonic wall thickening involving the transverse, descending, sigmoid colon and rectum. There is no evidence for active arterial extravasation. --Appendix: Normal. Lymphatic: --No retroperitoneal lymphadenopathy. --No mesenteric lymphadenopathy. --No pelvic or inguinal lymphadenopathy. Reproductive: Status post hysterectomy. No adnexal mass. Other: There is a small amount of free fluid in the patient's pelvis, likely reactive. The abdominal wall is normal. Musculoskeletal. No acute displaced fractures. IMPRESSION: 1. Diffuse colonic wall thickening as detailed above favored to be secondary to infectious or inflammatory colitis.  There is no evidence for active contrast extravasation into the bowel. There is no bowel obstruction. 2. Small amount of free fluid in the patient's pelvis, likely reactive. 3. Hepatic steatosis. Aortic Atherosclerosis (ICD10-I70.0). Electronically Signed   By: Constance Holster M.D.   On: 09/17/2019 23:53    Principal Problem:   Hematochezia Active Problems:   GERD   Hypothyroidism   Dyslipidemia    Tye Savoy, NP-C @  09/18/2019, 11:08 AM

## 2019-09-18 NOTE — H&P (Addendum)
[facility] History and Physical  Monique Ellis [id number] DOB: Aug 10, 1945 DOA: 09/17/2019  Referring EDP: Marlon Pel, PA PCP: Velna Hatchet, MD   Chief Complaint: Hematochezia  HPI: Monique Ellis is a 74 y.o. female with PMH GERD, hypothyroidism, HLD who presented to ER for hematochezia and admitted for further workup.  Patient reports feeling in her usual state of health until early Sunday morning. She had driven to [place] with her neighbor this weekend to see her daughter. She was woken up early Sunday morning with lower pelvic/abdominal pain and then proceeded to have diarrhea which was mostly bloody. Reports this occurred every few hours throughout the day; about 8+ times. She has not eaten anything today. Denies episodes like this previously. No one else with similar symptoms. She has used an NSAID for arthritis maybe 3x in last week. Denies increase in stress. Reports history of GERD for which she takes a PPI but denies recent worsening. Some sweating and feeling chilled during episodes of bloody diarrhea, but otherwise denies fever, chills, cough, SOB, chest pain, nausea, vomiting, constipation, dysuria, hematuria, melena, difficulty moving arms/legs, speech difficulty, confusion or any other complaints.  In the ED: Mild hypertension otherwise vitals WNL. Labs remarkable for: Lipase and CMP WNL. WBC 10.9 and Hgb 14.8. CTA Abd/Pelv: 1. Diffuse colonic wall thickening as detailed above favored to be secondary to infectious or inflammatory colitis. There is no evidence for active contrast extravasation into the bowel. There is no bowel obstruction. 2. Small amount of free fluid in the patient's pelvis, likely reactive. 3. Hepatic steatosis.  Patient was given IV Flagyl/Cipro and EDP called for admission for further workup.   Review of Systems:  All other systems negative unless noted above in HPI.   Past Medical History:  Diagnosis Date  . Barrett's esophagus   . GERD  (gastroesophageal reflux disease)   . Hyperlipidemia   . Hypothyroidism   . Seasonal allergies    Past Surgical History:  Procedure Laterality Date  . ABDOMINAL HYSTERECTOMY    . APPENDECTOMY  1954  . CARPAL TUNNEL RELEASE     bilateral  . NASAL SINUS SURGERY     x 3  . NEUROPLASTY / TRANSPOSITION MEDIAN NERVE AT CARPAL TUNNEL BILATERAL  2000  . ROTATOR CUFF REPAIR  2009   right  . TONSILLECTOMY  1980  . trigger thumb  2010   right   Social History:  reports that she has never smoked. She has never used smokeless tobacco. She reports current alcohol use. She reports that she does not use drugs.  No Known Allergies  Family History  Problem Relation Age of Onset  . Heart attack Father   . Heart disease Brother   . Heart disease Paternal Uncle   . Heart disease Paternal Grandfather   . Pancreatic cancer Maternal Aunt   . Breast cancer Maternal Aunt   . Colon cancer Neg Hx     Prior to Admission medications   Medication Sig Start Date End Date Taking? Authorizing Provider  aspirin 81 MG tablet Take 81 mg by mouth daily.    [provider]  calcium carbonate (OS-CAL) 600 MG TABS Take 600 mg by mouth daily.    [provider]  Cholecalciferol (VITAMIN D-3) 5000 UNITS TABS Take by mouth daily.    [provider]  docusate sodium (COLACE) 250 MG capsule Take 250 mg by mouth 2 (two) times daily.    [provider]  esomeprazole (NEXIUM) 40 MG capsule Take 1 capsule (40  mg total) by mouth 2 (two) times daily. 11/29/13 07/25/18  Pyrtle, Lajuan Lines, MD  estradiol (VIVELLE-DOT) 0.025 MG/24HR Place 1 patch onto the skin 2 (two) times a week.    [provider]  levothyroxine (SYNTHROID, LEVOTHROID) 75 MCG tablet Take 75 mcg by mouth daily.    [provider]  pseudoephedrine (SUDAFED) 30 MG tablet Take 30 mg by mouth every 4 (four) hours as needed for congestion.    [provider]  simvastatin (ZOCOR) 20 MG tablet Take 20 mg by  mouth daily.    [provider]   Physical Exam: Vitals:   09/17/19 2245 09/17/19 2300 09/18/19 0041 09/18/19 0100  BP: (!) 147/78 (!) 156/74 (!) 143/82 (!) 153/67  Pulse: 79 79 85 79  Resp: 15 14 15 16   Temp:      TempSrc:      SpO2: 96% 96% 98% 97%    Wt Readings from Last 3 Encounters:  07/25/18 68 kg  11/13/13 68.9 kg  11/26/11 69.2 kg    General:  Appears calm and comfortable. AAOx4 Eyes: EOMI, normal lids, irises & conjunctiva ENT: grossly normal hearing Neck: normal ROM Cardiovascular: RRR, no m/r/g. No LE edema. Respiratory: CTA bilaterally, no w/r/r. Normal respiratory effort. Abdomen: soft, ntnd Skin: no rash or induration seen on limited exam Musculoskeletal: grossly normal tone BUE/BLE Psychiatric: grossly normal mood and affect, speech fluent and appropriate Neurologic: grossly non-focal.          Labs on Admission:  Basic Metabolic Panel: Recent Labs  Lab 09/17/19 2201 09/17/19 2307  NA 138  --   K 3.8  --   CL 103  --   CO2 24  --   GLUCOSE 133*  --   BUN 28*  --   CREATININE 0.93 0.80  CALCIUM 9.5  --    Liver Function Tests: Recent Labs  Lab 09/17/19 2201  AST 19  ALT 25  ALKPHOS 122  BILITOT 0.6  PROT 7.5  ALBUMIN 4.4   Recent Labs  Lab 09/17/19 2201  LIPASE 20   No results for input(s): AMMONIA in the last 168 hours. CBC: Recent Labs  Lab 09/17/19 2201  WBC 10.9*  HGB 14.8  HCT 45.0  MCV 94.7  PLT 281   Cardiac Enzymes: No results for input(s): CKTOTAL, CKMB, CKMBINDEX, TROPONINI in the last 168 hours.  BNP (last 3 results) No results for input(s): BNP in the last 8760 hours.  ProBNP (last 3 results) No results for input(s): PROBNP in the last 8760 hours.  CBG: No results for input(s): GLUCAP in the last 168 hours.  Radiological Exams on Admission: CT Angio Abd/Pel W and/or Wo Contrast  Result Date: 09/17/2019 CLINICAL DATA:  Bright red blood per rectum. EXAM: CTA ABDOMEN AND PELVIS WITHOUT AND WITH  CONTRAST TECHNIQUE: Multidetector CT imaging of the abdomen and pelvis was performed using the standard protocol during bolus administration of intravenous contrast. Multiplanar reconstructed images and MIPs were obtained and reviewed to evaluate the vascular anatomy. CONTRAST:  148mL OMNIPAQUE IOHEXOL 350 MG/ML SOLN COMPARISON:  None. FINDINGS: VASCULAR Aorta: Normal caliber aorta without aneurysm, dissection, vasculitis or significant stenosis. Celiac: There is mild narrowing at the origin of the celiac axis. There is normal hepatic arterial anatomy. A prominent supraduodenal artery is noted. SMA: Patent without evidence of aneurysm, dissection, vasculitis or significant stenosis. Renals: Both renal arteries are patent without evidence of aneurysm, dissection, vasculitis, fibromuscular dysplasia or significant stenosis. IMA: Patent without evidence of aneurysm, dissection,  vasculitis or significant stenosis. Inflow: Patent without evidence of aneurysm, dissection, vasculitis or significant stenosis. Proximal Outflow: Bilateral common femoral and visualized portions of the superficial and profunda femoral arteries are patent without evidence of aneurysm, dissection, vasculitis or significant stenosis. Veins: No obvious venous abnormality within the limitations of this arterial phase study. Review of the MIP images confirms the above findings. NON-VASCULAR Lower chest: The lung bases are clear. The heart size is normal. Hepatobiliary: There is decreased hepatic attenuation suggestive of hepatic steatosis. Normal gallbladder.There is no biliary ductal dilation. Pancreas: Normal contours without ductal dilatation. No peripancreatic fluid collection. Spleen: No splenic laceration or hematoma. Adrenals/Urinary Tract: --Adrenal glands: No adrenal hemorrhage. --Right kidney/ureter: No hydronephrosis or perinephric hematoma. --Left kidney/ureter: No hydronephrosis or perinephric hematoma. --Urinary bladder: Unremarkable.  Stomach/Bowel: --Stomach/Duodenum: No hiatal hernia or other gastric abnormality. Normal duodenal course and caliber. --Small bowel: No dilatation or inflammation. --Colon: There is diffuse colonic wall thickening involving the transverse, descending, sigmoid colon and rectum. There is no evidence for active arterial extravasation. --Appendix: Normal. Lymphatic: --No retroperitoneal lymphadenopathy. --No mesenteric lymphadenopathy. --No pelvic or inguinal lymphadenopathy. Reproductive: Status post hysterectomy. No adnexal mass. Other: There is a small amount of free fluid in the patient's pelvis, likely reactive. The abdominal wall is normal. Musculoskeletal. No acute displaced fractures. IMPRESSION: 1. Diffuse colonic wall thickening as detailed above favored to be secondary to infectious or inflammatory colitis. There is no evidence for active contrast extravasation into the bowel. There is no bowel obstruction. 2. Small amount of free fluid in the patient's pelvis, likely reactive. 3. Hepatic steatosis. Aortic Atherosclerosis (ICD10-I70.0). Electronically Signed   By: Constance Holster M.D.   On: 09/17/2019 23:53    EKG: None  Assessment/Plan Principal Problem:   Hematochezia Active Problems:   GERD   Hypothyroidism   Dyslipidemia  74 y.o. female with PMH GERD, hypothyroidism, HLD who presented to ER for hematochezia and admitted for further workup.   Hematochezia - possibly from infectious colitis as shown on CT, lower GI bleed also a possibility - cont IV Flagyl/Cipro - WBC mildly elevated  - Type and Screen - CBC q8h - Prepare 2 units ahead - Ensure two IV's in place - GI consulted and will see patient this AM - NPO - cont PTA PPI - GI not yet consulted; consult later this AM as needed  - EGD and Colonoscopy last done by [facility] in March 2013 with mild gastritis and normal colonoscopy  GERD with remote hx of Barrett's esophagus - cont PTA PPI - patient denies any recent concerns  with GERD and symptoms more suggestive of lower GI bleed/etiology   Hypothyroid - cont PTA Synthroid  HLD - cont PTA Statin   Code Status: Full Code DVT Prophylaxis: None, active bleed Family Communication: None. Neighbor, Beth at bedside.  Disposition Plan: Admit to inpatient. Patient is at high risk for further decompensation due to age and co-morbidities. Currently requiring IV abx and may require blood transfusion and GI consult.   Time spent: 70 minutes  Chauncey Mann, MD [facility] Pager [phone]

## 2019-09-18 NOTE — ED Notes (Signed)
Family made aware of patients room number and hospital visiting hours.

## 2019-09-18 NOTE — Plan of Care (Signed)
Plan of care 

## 2019-09-18 NOTE — Progress Notes (Signed)
Patient arrived to unit via stretcher from the ED, able to walk to the bed without difficulty, gait steady, A&Ox4, skin intact, all body systems with the exception of GI are WNL, +bsx4, dull pain in mid lower abdomen, states she had another loose, bright red BM downstairs in the ED before coming up to her room,awaiting GI consult later this morning, Covid swab done and sent to lab, blood bank has 4 units of PRBC's on hold, vss, oriented to room and call bell, will monitor.

## 2019-09-18 NOTE — Progress Notes (Signed)
Monique Ellis is a 74 y.o. female with PMH GERD, hypothyroidism, HLD who presented to [facility] ER for sudden onset bloody stools.  Onset on Saturday 2 days prior to presentation.  LUQ and LLQ abdominal pain.  No fevers.  Intermittent nausea.  No prior history of hematochezia.  No personal or family history of colitis or GI cancer.  Presented to [facility] ED for further evaluation.  States between Saturday and Sunday she had 12 bloody stools.  Last bloody stool was earlier this morning around 2:30 AM.  In the ED initial hemoglobin 14.  CT abdomen and pelvis w/wo contrast 3/21 showed evidence of infectious or inflammatory colitis.  Repeated hemoglobin this morning down to 12.  Reports use of NSAIDs judiciously 3 X in the last week due to joint pain.  She takes a PPI regularly for history of mild gastritis.  Last EGD and colonoscopy were in 2013 by [facility] with no significant findings.  09/18/2019: Seen and examined.  Reports intermittent nausea with no vomiting.  Left upper quadrant and left lower quadrant tenderness with palpation on exam.  Last bloody stool around 2:30 AM.  [facility] contacted and will see in consultation.  N.p.o. until cleared by GI.  Please refer to H&P dictated by my partner Dr. Marice Potter on 09/18/19 for further details of the assessment and plan.

## 2019-09-19 DIAGNOSIS — K921 Melena: Principal | ICD-10-CM

## 2019-09-19 DIAGNOSIS — R935 Abnormal findings on diagnostic imaging of other abdominal regions, including retroperitoneum: Secondary | ICD-10-CM

## 2019-09-19 DIAGNOSIS — K559 Vascular disorder of intestine, unspecified: Secondary | ICD-10-CM

## 2019-09-19 DIAGNOSIS — R1030 Lower abdominal pain, unspecified: Secondary | ICD-10-CM

## 2019-09-19 LAB — TYPE AND SCREEN
ABO/RH(D): O POS
Antibody Screen: NEGATIVE
Unit division: 0
Unit division: 0
Unit division: 0
Unit division: 0

## 2019-09-19 LAB — CBC
HCT: 39.1 % (ref 36.0–46.0)
Hemoglobin: 12.9 g/dL (ref 12.0–15.0)
MCH: 31.5 pg (ref 26.0–34.0)
MCHC: 33 g/dL (ref 30.0–36.0)
MCV: 95.6 fL (ref 80.0–100.0)
Platelets: 233 10*3/uL (ref 150–400)
RBC: 4.09 MIL/uL (ref 3.87–5.11)
RDW: 12.3 % (ref 11.5–15.5)
WBC: 7.9 10*3/uL (ref 4.0–10.5)
nRBC: 0 % (ref 0.0–0.2)

## 2019-09-19 LAB — BASIC METABOLIC PANEL
Anion gap: 8 (ref 5–15)
Anion gap: 7 (ref 5–15)
BUN: 13 mg/dL (ref 8–23)
BUN: 13 mg/dL (ref 8–23)
CO2: 24 mmol/L (ref 22–32)
CO2: 24 mmol/L (ref 22–32)
Calcium: 8.5 mg/dL — ABNORMAL LOW (ref 8.9–10.3)
Calcium: 8.5 mg/dL — ABNORMAL LOW (ref 8.9–10.3)
Chloride: 105 mmol/L (ref 98–111)
Chloride: 104 mmol/L (ref 98–111)
Creatinine, Ser: 0.91 mg/dL (ref 0.44–1.00)
Creatinine, Ser: 0.88 mg/dL (ref 0.44–1.00)
GFR calc Af Amer: >60 mL/min (ref >60)
GFR calc Af Amer: >60 mL/min (ref >60)
GFR calc non Af Amer: >60 mL/min (ref >60)
GFR calc non Af Amer: >60 mL/min (ref >60)
Glucose, Bld: 117 mg/dL — ABNORMAL HIGH (ref 70–99)
Glucose, Bld: 131 mg/dL — ABNORMAL HIGH (ref 70–99)
Potassium: 4.3 mmol/L (ref 3.5–5.1)
Potassium: 4.2 mmol/L (ref 3.5–5.1)
Sodium: 137 mmol/L (ref 135–145)
Sodium: 135 mmol/L (ref 135–145)

## 2019-09-19 LAB — BPAM RBC
Blood Product Expiration Date: 202104202359
Blood Product Expiration Date: 202104202359
Blood Product Expiration Date: 202104202359
Blood Product Expiration Date: 202104202359
Unit Type and Rh: 5100
Unit Type and Rh: 5100
Unit Type and Rh: 5100
Unit Type and Rh: 5100

## 2019-09-19 LAB — C DIFFICILE QUICK SCREEN W PCR REFLEX
C Diff antigen: NEGATIVE
C Diff interpretation: NOT DETECTED
C Diff toxin: NEGATIVE

## 2019-09-19 MED ORDER — PEG-KCL-NACL-NASULF-NA ASC-C 100 G PO SOLR: 0.5000 | Freq: Once | ORAL | Status: DC

## 2019-09-19 MED ORDER — SODIUM CHLORIDE 0.9 % IV SOLN: INTRAVENOUS | Status: DC

## 2019-09-19 MED ORDER — METRONIDAZOLE IN NACL 5-0.79 MG/ML-% IV SOLN
500.0000 mg | Freq: Three times a day (TID) | INTRAVENOUS | Status: DC
  Administered 2019-09-19 – 2019-09-20 (×4): 500 mg via INTRAVENOUS
  Filled 2019-09-19 (×4): qty 100

## 2019-09-19 MED ORDER — PEG-KCL-NACL-NASULF-NA ASC-C 100 G PO SOLR
0.5000 | Freq: Once | ORAL | Status: DC
  Filled 2019-09-19: qty 1

## 2019-09-19 NOTE — Progress Notes (Addendum)
[facility] Progress Note  CC:  Lower abdominal pain and hematochezia   Subjective: She is having less lower abdominal pain. She passed a small amount or red blood with water per the rectum this morning, no stool. She is tolerating a clear liquid diet without difficulty. Family at bed side.    Objective:  Vital signs in last 24 hours: Temp:  [98.5 F (36.9 C)-99.8 F (37.7 C)] 98.5 F (36.9 C) (03/23 0604) Pulse Rate:  [76-87] 76 (03/23 0604) Resp:  [15-20] 18 (03/23 0604) BP: (134-149)/(69-74) 134/74 (03/23 0604) SpO2:  [97 %-100 %] 98 % (03/23 0604) Last BM Date: 09/19/19 General:   Alert, well developed 74 year old female in NAD. Heart: RRR, no murmurs.  Pulm:  Breath sounds clear throughout, no crackles, wheezes or rhonchi.  Abdomen: Soft, nondistended. Mild LLQ tenderness without rebound or guarding.  + BS x 4 quads. RLQ scar intact.  Extremities:  Without edema. Neurologic:  Alert and  oriented x4;  grossly normal neurologically. Psych:  Alert and cooperative. Normal mood and affect.  Intake/Output from previous day: 03/22 0701 - 03/23 0700 In: 1891.9 [P.O.:1340; IV Piggyback:551.9] Out: 850 [Urine:850] Intake/Output this shift: No intake/output data recorded.  Lab Results: Recent Labs    09/18/19 1332 09/18/19 2206 09/19/19 0543  WBC 8.5 9.0 7.9  HGB 12.5 12.7 12.9  HCT 37.5 38.8 39.1  PLT 198 219 233   BMET Recent Labs    09/17/19 2201 09/17/19 2201 09/17/19 2307 09/19/19 0303 09/19/19 0543  NA 138  --   --  137 135  K 3.8  --   --  4.3 4.2  CL 103  --   --  105 104  CO2 24  --   --  24 24  GLUCOSE 133*  --   --  117* 131*  BUN 28*  --   --  13 13  CREATININE 0.93   < > 0.80 0.91 0.88  CALCIUM 9.5  --   --  8.5* 8.5*   < > = values in this interval not displayed.   LFT Recent Labs    09/17/19 2201  PROT 7.5  ALBUMIN 4.4  AST 19  ALT 25  ALKPHOS 122  BILITOT 0.6   PT/INR No results for input(s): LABPROT, INR in the  last 72 hours. Hepatitis Panel No results for input(s): HEPBSAG, HCVAB, HEPAIGM, HEPBIGM in the last 72 hours.  CT Angio Abd/Pel W and/or Wo Contrast  Result Date: 09/17/2019 CLINICAL DATA:  Bright red blood per rectum. EXAM: CTA ABDOMEN AND PELVIS WITHOUT AND WITH CONTRAST TECHNIQUE: Multidetector CT imaging of the abdomen and pelvis was performed using the standard protocol during bolus administration of intravenous contrast. Multiplanar reconstructed images and MIPs were obtained and reviewed to evaluate the vascular anatomy. CONTRAST:  131mL OMNIPAQUE IOHEXOL 350 MG/ML SOLN COMPARISON:  None. FINDINGS: VASCULAR Aorta: Normal caliber aorta without aneurysm, dissection, vasculitis or significant stenosis. Celiac: There is mild narrowing at the origin of the celiac axis. There is normal hepatic arterial anatomy. A prominent supraduodenal artery is noted. SMA: Patent without evidence of aneurysm, dissection, vasculitis or significant stenosis. Renals: Both renal arteries are patent without evidence of aneurysm, dissection, vasculitis, fibromuscular dysplasia or significant stenosis. IMA: Patent without evidence of aneurysm, dissection, vasculitis or significant stenosis. Inflow: Patent without evidence of aneurysm, dissection, vasculitis or significant stenosis. Proximal Outflow: Bilateral common femoral and visualized portions of the superficial and profunda femoral arteries are patent without evidence of  aneurysm, dissection, vasculitis or significant stenosis. Veins: No obvious venous abnormality within the limitations of this arterial phase study. Review of the MIP images confirms the above findings. NON-VASCULAR Lower chest: The lung bases are clear. The heart size is normal. Hepatobiliary: There is decreased hepatic attenuation suggestive of hepatic steatosis. Normal gallbladder.There is no biliary ductal dilation. Pancreas: Normal contours without ductal dilatation. No peripancreatic fluid collection.  Spleen: No splenic laceration or hematoma. Adrenals/Urinary Tract: --Adrenal glands: No adrenal hemorrhage. --Right kidney/ureter: No hydronephrosis or perinephric hematoma. --Left kidney/ureter: No hydronephrosis or perinephric hematoma. --Urinary bladder: Unremarkable. Stomach/Bowel: --Stomach/Duodenum: No hiatal hernia or other gastric abnormality. Normal duodenal course and caliber. --Small bowel: No dilatation or inflammation. --Colon: There is diffuse colonic wall thickening involving the transverse, descending, sigmoid colon and rectum. There is no evidence for active arterial extravasation. --Appendix: Normal. Lymphatic: --No retroperitoneal lymphadenopathy. --No mesenteric lymphadenopathy. --No pelvic or inguinal lymphadenopathy. Reproductive: Status post hysterectomy. No adnexal mass. Other: There is a small amount of free fluid in the patient's pelvis, likely reactive. The abdominal wall is normal. Musculoskeletal. No acute displaced fractures. IMPRESSION: 1. Diffuse colonic wall thickening as detailed above favored to be secondary to infectious or inflammatory colitis. There is no evidence for active contrast extravasation into the bowel. There is no bowel obstruction. 2. Small amount of free fluid in the patient's pelvis, likely reactive. 3. Hepatic steatosis. Aortic Atherosclerosis (ICD10-I70.0). Electronically Signed   By: Constance Holster M.D.   On: 09/17/2019 23:53    Assessment / Plan:  51. 74 year old female with lower abdominal pain and hematochezia. CT angiogram of the abd/pelvis 3/21 showed diffuse colonic wall thickening throughout the colon and rectum. Infectious verses ischemic colitis. C. Diff negative. On Cipro and Flagyl IV. GI pathogen panel pending. WBC 7.9. Stable Hg 12.9. Afebrile. Hemodynamically stable.  -Clear liquid diet today. NPO after midnight.   -CBC and BMP in am   2. Hepatic steatosis. Normal LFTs. -follow up as outpatient   3. History of GERD -Continue  Pantoprazole 40mg  po daily    Principal Problem:   Hematochezia Active Problems:   GERD   Hypothyroidism   Dyslipidemia     LOS: 1 day   Noralyn Pick  09/19/2019, 9:39 AM  GI ATTENDING  Interval history and data reviewed.  Patient personally seen and examined.  Family in room.  Patient doing better.  No nausea or vomiting.  Abdominal pain much improved.  Bleeding is cleared after several bowel movements.  Abdominal exam with minimal pain only.  C. difficile negative.  Remainder of GI pathogen panel pending.  IMPRESSION: 1.  Acute ischemic colitis in the classic watershed region.  Improving 2.  Colonoscopy 2013 (Dr. Sharlett Iles) unremarkable. RECOMMENDATIONS: 1.  Advance diet 2.  Cancel colonoscopy.  This will not affect management and may increase risk in the face of acute ischemia.  Furthermore, will not allow adequate mucosal evaluation for screening purposes. 3.  Okay for discharge home tomorrow 4.  We see in a.m. and will arrange outpatient GI follow-up.  Plans for colonoscopy in 6 to 8 weeks. Discussed with patient and family.  Docia Chuck. Geri Seminole., M.D. [facility] Division of Gastroenterology

## 2019-09-19 NOTE — Progress Notes (Signed)
PROGRESS NOTE  Monique Ellis WFU:[id number] DOB: 27-Jul-1945 DOA: 09/17/2019 PCP: Velna Hatchet, MD  HPI/Recap of past 24 hours:  [place] Dec a 74 y.o.femalewith PMH GERD, hypothyroidism, HLD who presented to [facility] ER for sudden onset rectal bleed.  Onset 2 days prior to presentation associated with LUQ and LLQ abdominal pain.  No fevers.  Intermittent nausea with no vomiting.  No prior history of hematochezia.  No personal or family history of colitis or GI cancer.  Presented to [facility] ED for further evaluation.  States prior to presentation she had 12 bloody stools.  In the ED initial hemoglobin 14.  CT abdomen and pelvis w/wo contrast 3/21 showed diffuse colonic wall thickening throughout the colon and rectum suggestive of infectious or inflammatory colitis.  Repeated hemoglobin down to 12.  She takes a PPI regularly for history of mild gastritis.  Last EGD and colonoscopy were in 2013 by [facility] [place] with no significant findings.  GI consulted with plan for colonoscopy possibly on 09/20/2019.  3/23: Seen and examined at her bedside.  Reports bloody watery stools this morning.  H&H and vital signs stable.  Left lower quadrant abdominal pain present but improving.  Assessment/Plan: Principal Problem:   Hematochezia Active Problems:   GERD   Hypothyroidism   Dyslipidemia  Hematochezia with findings suggestive of infectious versus inflammatory colitis on CT scan C. difficile negative GI panel in process On IV antibiotics empirically with IV Cipro and IV Flagyl, continue for now until active infective process has been ruled out. Hematochezia persists H&H and vital signs stable, continue to closely monitor Planned colonoscopy on 09/20/2019 by [facility] [place]. Management directed by GI  Acute blood loss anemia secondary to lower GI bleed Hemoglobin at baseline is 14K Hemoglobin down to 12K. Continue to monitor and transfuse as indicated  History of mild gastritis Continue PTA PPI Avoid  NSAIDs  Hepatic steatosis  LFTs are normal Weight loss outpatient  HLD C/w zocor  Hypothyroridism C/w levothyroxine   Code Status: Full Code  DVT Prophylaxis: SCDs, Chemical DVT ppx contraindicated due to active lower GI bleed  Family Communication: Will call family if ok with the patient.   Disposition Plan:  Patient is from home.  Anticipate discharge to home when rectal bleeding has resolved and GI signs off.  Barrier to discharge: Ongoing rectal bleed with plan for colonoscopy on 09/20/2019.   Consultants:  [facility]  Procedures:  None  Antimicrobials:  Cipro and Flagyl    Objective: Vitals:   09/18/19 0858 09/18/19 1303 09/18/19 2034 09/19/19 0604  BP: (!) 143/68 137/69 (!) 149/69 134/74  Pulse: 81 87 83 76  Resp: '15 15 20 18  '$ Temp: 97.6 F (36.4 C) 99.3 F (37.4 C) 99.8 F (37.7 C) 98.5 F (36.9 C)  TempSrc:   Oral Oral  SpO2: 97% 100% 97% 98%  Weight:      Height:        Intake/Output Summary (Last 24 hours) at 09/19/2019 1406 Last data filed at 09/19/2019 1042 Gross per 24 hour  Intake 971.85 ml  Output 850 ml  Net 121.85 ml   Filed Weights   09/18/19 0306  Weight: 67.1 kg    Exam:  . General: 74 y.o. year-old female well developed well nourished in no acute distress.  Alert and oriented x3. . Cardiovascular: Regular rate and rhythm with no rubs or gallops.  No thyromegaly or JVD noted.   Marland Kitchen Respiratory: Clear to auscultation with no wheezes or rales. Good inspiratory effort. Marland Kitchen  Abdomen: Soft left lower quadrant tenderness with palpation.  Bowel sounds present. . Musculoskeletal: No lower extremity edema. 2/4 pulses in all 4 extremities. Marland Kitchen Psychiatry: Mood is appropriate for condition and setting   Data Reviewed: CBC: Recent Labs  Lab 09/17/19 2201 09/18/19 0526 09/18/19 1332 09/18/19 2206 09/19/19 0543  WBC 10.9* 9.2 8.5 9.0 7.9  HGB 14.8 12.9 12.5 12.7 12.9  HCT 45.0 39.3 37.5 38.8 39.1  MCV 94.7 93.6 95.4 95.3 95.6  PLT  281 243 198 219 263   Basic Metabolic Panel: Recent Labs  Lab 09/17/19 2201 09/17/19 2307 09/19/19 0303 09/19/19 0543  NA 138  --  137 135  K 3.8  --  4.3 4.2  CL 103  --  105 104  CO2 24  --  24 24  GLUCOSE 133*  --  117* 131*  BUN 28*  --  13 13  CREATININE 0.93 0.80 0.91 0.88  CALCIUM 9.5  --  8.5* 8.5*   GFR: Estimated Creatinine Clearance: 52 mL/min (by C-G formula based on SCr of 0.88 mg/dL). Liver Function Tests: Recent Labs  Lab 09/17/19 2201  AST 19  ALT 25  ALKPHOS 122  BILITOT 0.6  PROT 7.5  ALBUMIN 4.4   Recent Labs  Lab 09/17/19 2201  LIPASE 20   No results for input(s): AMMONIA in the last 168 hours. Coagulation Profile: No results for input(s): INR, PROTIME in the last 168 hours. Cardiac Enzymes: No results for input(s): CKTOTAL, CKMB, CKMBINDEX, TROPONINI in the last 168 hours. BNP (last 3 results) No results for input(s): PROBNP in the last 8760 hours. HbA1C: No results for input(s): HGBA1C in the last 72 hours. CBG: No results for input(s): GLUCAP in the last 168 hours. Lipid Profile: No results for input(s): CHOL, HDL, LDLCALC, TRIG, CHOLHDL, LDLDIRECT in the last 72 hours. Thyroid Function Tests: No results for input(s): TSH, T4TOTAL, FREET4, T3FREE, THYROIDAB in the last 72 hours. Anemia Panel: No results for input(s): VITAMINB12, FOLATE, FERRITIN, TIBC, IRON, RETICCTPCT in the last 72 hours. Urine analysis:    Component Value Date/Time   COLORURINE YELLOW 09/17/2019 2201   APPEARANCEUR CLEAR 09/17/2019 2201   LABSPEC <1.005 (L) 09/17/2019 2201   PHURINE 5.5 09/17/2019 2201   GLUCOSEU NEGATIVE 09/17/2019 2201   HGBUR NEGATIVE 09/17/2019 2201   BILIRUBINUR NEGATIVE 09/17/2019 2201   KETONESUR TRACE (A) 09/17/2019 2201   PROTEINUR NEGATIVE 09/17/2019 2201   NITRITE NEGATIVE 09/17/2019 2201   LEUKOCYTESUR NEGATIVE 09/17/2019 2201   Sepsis Labs: '@LABRCNTIP'$ (procalcitonin:4,lacticidven:4)  ) Recent Results (from the past 240  hour(s))  SARS CORONAVIRUS 2 (TAT 6-24 HRS) Nasopharyngeal Nasopharyngeal Swab     Status: None   Collection Time: 09/18/19  3:56 AM   Specimen: Nasopharyngeal Swab  Result Value Ref Range Status   SARS Coronavirus 2 NEGATIVE NEGATIVE Final    Comment: (NOTE) SARS-CoV-2 target nucleic acids are NOT DETECTED. The SARS-CoV-2 RNA is generally detectable in upper and lower respiratory specimens during the acute phase of infection. Negative results do not preclude SARS-CoV-2 infection, do not rule out co-infections with other pathogens, and should not be used as the sole basis for treatment or other patient management decisions. Negative results must be combined with clinical observations, patient history, and epidemiological information. The expected result is Negative. Fact Sheet for Patients: SugarRoll.be Fact Sheet for Healthcare Providers: https://www.woods-mathews.com/ This test is not yet approved or cleared by the [place] FDA and  has been authorized for detection and/or diagnosis of SARS-CoV-2 by FDA under  an Emergency Use Authorization (EUA). This EUA will remain  in effect (meaning this test can be used) for the duration of the COVID-19 declaration under Section 56 4(b)(1) of the Act, 21 U.S.C. section 360bbb-3(b)(1), unless the authorization is terminated or revoked sooner. Performed at [facility], [place] [address], [place], [place] [address]   C difficile quick scan w PCR reflex     Status: None   Collection Time: 09/18/19  6:47 PM   Specimen: STOOL  Result Value Ref Range Status   C Diff antigen NEGATIVE NEGATIVE Final   C Diff toxin NEGATIVE NEGATIVE Final   C Diff interpretation No C. difficile detected.  Final    Comment: Performed at [facility], [place] [address] [place], [place] [address]      Studies: No results found.  Scheduled Meds: . sodium chloride   Intravenous Once  .  levothyroxine  75 mcg Oral Daily  . pantoprazole  40 mg Oral Daily  . peg 3350 powder  0.5 kit Oral Once   And  . peg 3350 powder  0.5 kit Oral Once  . simvastatin  40 mg Oral q1800    Continuous Infusions: . ciprofloxacin 400 mg (09/19/19 1158)  . metronidazole 500 mg (09/19/19 1403)     LOS: 1 day     Kayleen Memos, MD [facility] Pager [phone]  If 7PM-7AM, please contact night-coverage [email or web address] Password [facility] 09/19/2019, 2:06 PM

## 2019-09-20 ENCOUNTER — Telehealth: Payer: Self-pay | Admitting: Nurse Practitioner

## 2019-09-20 DIAGNOSIS — K922 Gastrointestinal hemorrhage, unspecified: Secondary | ICD-10-CM

## 2019-09-20 DIAGNOSIS — D62 Acute posthemorrhagic anemia: Secondary | ICD-10-CM

## 2019-09-20 DIAGNOSIS — K760 Fatty (change of) liver, not elsewhere classified: Secondary | ICD-10-CM

## 2019-09-20 LAB — BASIC METABOLIC PANEL
Anion gap: 7 (ref 5–15)
BUN: 9 mg/dL (ref 8–23)
CO2: 25 mmol/L (ref 22–32)
Calcium: 8.7 mg/dL — ABNORMAL LOW (ref 8.9–10.3)
Chloride: 106 mmol/L (ref 98–111)
Creatinine, Ser: 0.86 mg/dL (ref 0.44–1.00)
GFR calc Af Amer: >60 mL/min (ref >60)
GFR calc non Af Amer: >60 mL/min (ref >60)
Glucose, Bld: 116 mg/dL — ABNORMAL HIGH (ref 70–99)
Potassium: 4.5 mmol/L (ref 3.5–5.1)
Sodium: 138 mmol/L (ref 135–145)

## 2019-09-20 LAB — CBC
HCT: 38.1 % (ref 36.0–46.0)
Hemoglobin: 12.1 g/dL (ref 12.0–15.0)
MCH: 30.6 pg (ref 26.0–34.0)
MCHC: 31.8 g/dL (ref 30.0–36.0)
MCV: 96.5 fL (ref 80.0–100.0)
Platelets: 221 10*3/uL (ref 150–400)
RBC: 3.95 MIL/uL (ref 3.87–5.11)
RDW: 12.2 % (ref 11.5–15.5)
WBC: 6.2 10*3/uL (ref 4.0–10.5)
nRBC: 0 % (ref 0.0–0.2)

## 2019-09-20 LAB — GI PATHOGEN PANEL BY PCR, STOOL

## 2019-09-20 SURGERY — COLONOSCOPY WITH PROPOFOL
Anesthesia: Monitor Anesthesia Care

## 2019-09-20 MED ORDER — CIPROFLOXACIN HCL 500 MG PO TABS: 500.0000 mg | ORAL_TABLET | Freq: Two times a day (BID) | ORAL | 0 refills | Status: AC

## 2019-09-20 MED ORDER — FAMOTIDINE 20 MG PO TABS: 20.0000 mg | ORAL_TABLET | Freq: Every day | ORAL | 0 refills | Status: AC

## 2019-09-20 MED ORDER — METRONIDAZOLE 500 MG PO TABS: 500.0000 mg | ORAL_TABLET | Freq: Three times a day (TID) | ORAL | 0 refills | Status: AC

## 2019-09-20 NOTE — Telephone Encounter (Signed)
Monique Ellis, [place] patient you saw in the hospital has follow up appt with you on 4/7

## 2019-09-20 NOTE — Progress Notes (Addendum)
     [facility] Progress Note  CC:  Lower abdominal pain, hematochezia   Subjective:  No further blood per the rectum since yesterday morning. She passed 2 loose stools yesterday evening and once this morning. No abdominal pan. She complains of increased mucous in her throat which triggers her reflux. Questionable heartburn. She is taking Omeprazole daily and is requesting further reflux medication.   Objective:  Vital signs in last 24 hours: Temp:  [98 F (36.7 C)-98.7 F (37.1 C)] 98 F (36.7 C) (03/24 0545) Pulse Rate:  [65-79] 65 (03/24 0545) Resp:  [16] 16 (03/24 0545) BP: (111-134)/(69-79) 111/71 (03/24 0545) SpO2:  [97 %-98 %] 97 % (03/24 0545) Last BM Date: 09/19/19 General:   Alert, well developed in NAD.  Heart: RRR, no murmur.  Pulm:  Breath sounds clear throughout.  Abdomen: Soft, nondistended. Nontender. + BS x 4 quads. No HSM. Upper abdominal laparoscopic scar and RLQ scar intact.  Extremities:  Without edema. Neurologic:  Alert and  oriented x4;  grossly normal neurologically. Psych:  Alert and cooperative. Normal mood and affect.  Intake/Output from previous day: 03/23 0701 - 03/24 0700 In: 720 [P.O.:720] Out: 600 [Urine:600] Intake/Output this shift: No intake/output data recorded.  Lab Results: Recent Labs    09/18/19 2206 09/19/19 0543 09/20/19 0323  WBC 9.0 7.9 6.2  HGB 12.7 12.9 12.1  HCT 38.8 39.1 38.1  PLT 219 233 221   BMET Recent Labs    09/19/19 0303 09/19/19 0543 09/20/19 0323  NA 137 135 138  K 4.3 4.2 4.5  CL 105 104 106  CO2 24 24 25   GLUCOSE 117* 131* 116*  BUN 13 13 9   CREATININE 0.91 0.88 0.86  CALCIUM 8.5* 8.5* 8.7*   LFT Recent Labs    09/17/19 2201  PROT 7.5  ALBUMIN 4.4  AST 19  ALT 25  ALKPHOS 122  BILITOT 0.6   PT/INR No results for input(s): LABPROT, INR in the last 72 hours. Hepatitis Panel No results for input(s): HEPBSAG, HCVAB, HEPAIGM, HEPBIGM in the last 72 hours.  No results  found.  Assessment / Plan:  85. 74 year old female with lower abdominal pain and hematochezia. CT angiogram of the abd/pelvis 3/21 showed diffuse colonic wall thickening throughout the colon and rectum. Infectious verses ischemic colitis. C. Diff negative. GI pathogen panel pending. Colonoscopy for today was canceled.  Today WBC 6.2. Hg 12.1. -Heart healthy diet -Florastor 1 po bid to take for the next 2 to 4 weeks -Discharge home today -Patient to call our office if hematochezia recurs -Our office will contact patient to schedule office follow up in 2 weeks, she will need outpatient colonoscopy in 6 to 8 weeks with Dr. Hilarie Fredrickson   2. Hepatic steatosis. Normal LFTs. -follow up as outpatient   3. History of GERD.  -Continue Pantoprazole 40mg  po daily  -Add Famotidine 20mg  at bed time for LPR symptoms     Principal Problem:   Hematochezia Active Problems:   GERD   Hypothyroidism   Dyslipidemia   Ischemic colitis (HCC)   Abnormal CT of the abdomen   Lower abdominal pain     LOS: 2 days   Noralyn Pick  09/20/2019, 7:47 AM  GI ATTENDING  Interval history and labs reviewed. Agree with interval progress notes and plans as outlined.  Docia Chuck. Geri Seminole., M.D. [facility] of Gastroenterology

## 2019-09-20 NOTE — Discharge Summary (Signed)
Physician Discharge Summary  Monique Ellis [id number] DOB: 04-Aug-1945 DOA: 09/17/2019  PCP: Velna Hatchet, MD  Admit date: 09/17/2019 Discharge date: 09/20/2019  Admitted From: Home Disposition:  Home  Recommendations for Outpatient Follow-up:  1. Follow up with PCP in 1 week 2. Follow up with [facility] in 2 weeks.  Outpatient colonoscopy in 6 to 8 weeks with Dr. Hilarie Fredrickson  Discharge Condition: Stable CODE STATUS: Full code Diet recommendation: Heart healthy  Brief/Interim Summary: Monique Ellis a 74 y.o.femalewith PMH GERD, hypothyroidism, HLD who [facility]. Onset 2 days prior to presentation associated with LUQ and LLQabdominal pain. No fevers. Intermittent nausea with no vomiting. No prior history of hematochezia. No personal or family history of colitis or GI cancer. Presented to [facility] for further evaluation. States prior to presentationshe had 12 bloody stools. In the ED initial hemoglobin 14. CT abdomen and pelvis w/wo contrast 3/21showed diffuse colonic wall thickening throughout the colon and rectum suggestive of infectious or inflammatory colitis. Repeated hemoglobin down to 12.She takes a PPI regularly for history of mild gastritis. Last EGD and colonoscopy were in 2013 [facility].  GI consulted.  C. difficile was negative.  Patient was treated with supportive care and GI has plan to follow-up in the outpatient setting for outpatient colonoscopy in 6 to 8 weeks.  On day of discharge, she was feeling well and tolerating solid diet without any physical complaints or issues.  Rectal bleeding resolved.  Hemoglobin remained stable over the past 48 hours in the range of 12.  Discharge Diagnoses:  Principal Problem:   Hematochezia Active Problems:   GERD   Hypothyroidism   Gastritis   Dyslipidemia   Ischemic colitis (HCC)   Abnormal CT of the abdomen   Lower abdominal pain   Acute blood loss  anemia   GI bleed   Hepatic steatosis   Discharge Instructions  Discharge Instructions    Call MD for:  difficulty breathing, headache or visual disturbances   Complete by: As directed    Call MD for:  extreme fatigue   Complete by: As directed    Call MD for:  persistant dizziness or light-headedness   Complete by: As directed    Call MD for:  persistant nausea and vomiting   Complete by: As directed    Call MD for:  severe uncontrolled pain   Complete by: As directed    Call MD for:  temperature >100.4   Complete by: As directed    Diet - low sodium heart healthy   Complete by: As directed    Discharge instructions   Complete by: As directed    You were cared for by a hospitalist during your hospital stay. If you have any questions about your discharge medications or the care you received while you were in the hospital after you are discharged, you can call the unit and ask to speak with the hospitalist on call if the hospitalist that took care of you is not available. Once you are discharged, your primary care physician will handle any further medical issues. Please note that NO REFILLS for any discharge medications will be authorized once you are discharged, as it is imperative that you return to your primary care physician (or establish a relationship with a primary care physician if you do not have one) for your aftercare needs so that they can reassess your need for medications and monitor your lab values.   Increase activity slowly  Complete by: As directed      Allergies as of 09/20/2019   No Known Allergies     Medication List    TAKE these medications   aspirin 81 MG tablet Take 81 mg by mouth daily.   ciprofloxacin 500 MG tablet Commonly known as: Cipro Take 1 tablet (500 mg total) by mouth 2 (two) times daily for 2 days.   docusate sodium 250 MG capsule Commonly known as: COLACE Take 250 mg by mouth 2 (two) times daily.   esomeprazole 40 MG  capsule Commonly known as: NexIUM Take 1 capsule (40 mg total) by mouth 2 (two) times daily.   estradiol 0.025 MG/24HR Commonly known as: VIVELLE-DOT Place 1 patch onto the skin 2 (two) times a week.   famotidine 20 MG tablet Commonly known as: PEPCID Take 1 tablet (20 mg total) by mouth at bedtime.   fexofenadine 180 MG tablet Commonly known as: ALLEGRA Take 180 mg by mouth daily.   levothyroxine 75 MCG tablet Commonly known as: SYNTHROID Take 75 mcg by mouth daily.   loratadine 10 MG tablet Commonly known as: CLARITIN Take 10 mg by mouth daily as needed for allergies.   metroNIDAZOLE 500 MG tablet Commonly known as: Flagyl Take 1 tablet (500 mg total) by mouth 3 (three) times daily for 2 days.   simvastatin 40 MG tablet Commonly known as: ZOCOR Take 40 mg by mouth daily.   Vitamin D-3 125 MCG (5000 UT) Tabs Take by mouth daily.      Follow-up Information    Velna Hatchet, MD. Schedule an appointment as soon as possible for a visit in 1 week(s).   Specialty: Internal Medicine Contact information: [address] [place] [address] [phone]        [facility]. Schedule an appointment as soon as possible for a visit in 2 week(s).   Specialty: Gastroenterology Why: Their office will call you to set up follow up appointment Contact information: [place] [phone]         No Known Allergies  Consultations:  GI   Procedures/Studies: CT Angio Abd/Pel W and/or Wo Contrast  Result Date: 09/17/2019 CLINICAL DATA:  Bright red blood per rectum. EXAM: CTA ABDOMEN AND PELVIS WITHOUT AND WITH CONTRAST TECHNIQUE: Multidetector CT imaging of the abdomen and pelvis was performed using the standard protocol during bolus administration of intravenous contrast. Multiplanar reconstructed images and MIPs were obtained and reviewed to evaluate the vascular anatomy. CONTRAST:  110mL OMNIPAQUE IOHEXOL  350 MG/ML SOLN COMPARISON:  None. FINDINGS: VASCULAR Aorta: Normal caliber aorta without aneurysm, dissection, vasculitis or significant stenosis. Celiac: There is mild narrowing at the origin of the celiac axis. There is normal hepatic arterial anatomy. A prominent supraduodenal artery is noted. SMA: Patent without evidence of aneurysm, dissection, vasculitis or significant stenosis. Renals: Both renal arteries are patent without evidence of aneurysm, dissection, vasculitis, fibromuscular dysplasia or significant stenosis. IMA: Patent without evidence of aneurysm, dissection, vasculitis or significant stenosis. Inflow: Patent without evidence of aneurysm, dissection, vasculitis or significant stenosis. Proximal Outflow: Bilateral common femoral and visualized portions of the superficial and profunda femoral arteries are patent without evidence of aneurysm, dissection, vasculitis or significant stenosis. Veins: No obvious venous abnormality within the limitations of this arterial phase study. Review of the MIP images confirms the above findings. NON-VASCULAR Lower chest: The lung bases are clear. The heart size is normal. Hepatobiliary: There is decreased hepatic attenuation suggestive of hepatic steatosis. Normal gallbladder.There is no  biliary ductal dilation. Pancreas: Normal contours without ductal dilatation. No peripancreatic fluid collection. Spleen: No splenic laceration or hematoma. Adrenals/Urinary Tract: --Adrenal glands: No adrenal hemorrhage. --Right kidney/ureter: No hydronephrosis or perinephric hematoma. --Left kidney/ureter: No hydronephrosis or perinephric hematoma. --Urinary bladder: Unremarkable. Stomach/Bowel: --Stomach/Duodenum: No hiatal hernia or other gastric abnormality. Normal duodenal course and caliber. --Small bowel: No dilatation or inflammation. --Colon: There is diffuse colonic wall thickening involving the transverse, descending, sigmoid colon and rectum. There is no evidence for  active arterial extravasation. --Appendix: Normal. Lymphatic: --No retroperitoneal lymphadenopathy. --No mesenteric lymphadenopathy. --No pelvic or inguinal lymphadenopathy. Reproductive: Status post hysterectomy. No adnexal mass. Other: There is a small amount of free fluid in the patient's pelvis, likely reactive. The abdominal wall is normal. Musculoskeletal. No acute displaced fractures. IMPRESSION: 1. Diffuse colonic wall thickening as detailed above favored to be secondary to infectious or inflammatory colitis. There is no evidence for active contrast extravasation into the bowel. There is no bowel obstruction. 2. Small amount of free fluid in the patient's pelvis, likely reactive. 3. Hepatic steatosis. Aortic Atherosclerosis (ICD10-I70.0). Electronically Signed   By: Constance Holster M.D.   On: 09/17/2019 23:53       Discharge Exam: Vitals:   09/19/19 2036 09/20/19 0545  BP: 134/69 111/71  Pulse: 76 65  Resp: 16 16  Temp: 98.1 F (36.7 C) 98 F (36.7 C)  SpO2: 98% 97%    General: Pt is alert, awake, not in acute distress Cardiovascular: RRR, S1/S2 +, no edema Respiratory: CTA bilaterally, no wheezing, no rhonchi, no respiratory distress, no conversational dyspnea  Abdominal: Soft, NT, ND, bowel sounds + Extremities: no edema, no cyanosis Psych: Normal mood and affect, stable judgement and insight     The results of significant diagnostics from this hospitalization (including imaging, microbiology, ancillary and laboratory) are listed below for reference.     Microbiology: Recent Results (from the past 240 hour(s))  SARS CORONAVIRUS 2 (TAT 6-24 HRS) Nasopharyngeal Nasopharyngeal Swab     Status: None   Collection Time: 09/18/19  3:56 AM   Specimen: Nasopharyngeal Swab  Result Value Ref Range Status   SARS Coronavirus 2 NEGATIVE NEGATIVE Final    Comment: (NOTE) SARS-CoV-2 target nucleic acids are NOT DETECTED. The SARS-CoV-2 RNA is generally detectable in upper and  lower respiratory specimens during the acute phase of infection. Negative results do not preclude SARS-CoV-2 infection, do not rule out co-infections with other pathogens, and should not be used as the sole basis for treatment or other patient management decisions. Negative results must be combined with clinical observations, patient history, and epidemiological information. The expected result is Negative. Fact Sheet for Patients: SugarRoll.be Fact Sheet for Healthcare Providers: https://www.woods-mathews.com/ This test is not yet approved or cleared by the [place] FDA and  has been authorized for detection and/or diagnosis of SARS-CoV-2 by FDA under an Emergency Use Authorization (EUA). This EUA will remain  in effect (meaning this test can be used) for the duration of the COVID-19 declaration under Section 56 4(b)(1) of the Act, 21 U.S.C. section 360bbb-3(b)(1), unless the authorization is terminated or revoked sooner. Performed at [facility], [place] [address], [place], [place] [address]   C difficile quick scan w PCR reflex     Status: None   Collection Time: 09/18/19  6:47 PM   Specimen: STOOL  Result Value Ref Range Status   C Diff antigen NEGATIVE NEGATIVE Final   C Diff toxin NEGATIVE NEGATIVE Final   C Diff interpretation  No C. difficile detected.  Final    Comment: Performed at [facility], [place] [address], [place], [place] [address]     Labs: BNP (last 3 results) No results for input(s): BNP in the last 8760 hours. Basic Metabolic Panel: Recent Labs  Lab 09/17/19 2201 09/17/19 2307 09/19/19 0303 09/19/19 0543 09/20/19 0323  NA 138  --  137 135 138  K 3.8  --  4.3 4.2 4.5  CL 103  --  105 104 106  CO2 24  --  24 24 25   GLUCOSE 133*  --  117* 131* 116*  BUN 28*  --  13 13 9   CREATININE 0.93 0.80 0.91 0.88 0.86  CALCIUM 9.5  --  8.5* 8.5* 8.7*   Liver Function Tests: Recent Labs  Lab  09/17/19 2201  AST 19  ALT 25  ALKPHOS 122  BILITOT 0.6  PROT 7.5  ALBUMIN 4.4   Recent Labs  Lab 09/17/19 2201  LIPASE 20   No results for input(s): AMMONIA in the last 168 hours. CBC: Recent Labs  Lab 09/18/19 0526 09/18/19 1332 09/18/19 2206 09/19/19 0543 09/20/19 0323  WBC 9.2 8.5 9.0 7.9 6.2  HGB 12.9 12.5 12.7 12.9 12.1  HCT 39.3 37.5 38.8 39.1 38.1  MCV 93.6 95.4 95.3 95.6 96.5  PLT 243 198 219 233 221   Cardiac Enzymes: No results for input(s): CKTOTAL, CKMB, CKMBINDEX, TROPONINI in the last 168 hours. BNP: Invalid input(s): POCBNP CBG: No results for input(s): GLUCAP in the last 168 hours. D-Dimer No results for input(s): DDIMER in the last 72 hours. Hgb A1c No results for input(s): HGBA1C in the last 72 hours. Lipid Profile No results for input(s): CHOL, HDL, LDLCALC, TRIG, CHOLHDL, LDLDIRECT in the last 72 hours. Thyroid function studies No results for input(s): TSH, T4TOTAL, T3FREE, THYROIDAB in the last 72 hours.  Invalid input(s): FREET3 Anemia work up No results for input(s): VITAMINB12, FOLATE, FERRITIN, TIBC, IRON, RETICCTPCT in the last 72 hours. Urinalysis    Component Value Date/Time   COLORURINE YELLOW 09/17/2019 2201   APPEARANCEUR CLEAR 09/17/2019 2201   LABSPEC <1.005 (L) 09/17/2019 2201   PHURINE 5.5 09/17/2019 2201   GLUCOSEU NEGATIVE 09/17/2019 2201   HGBUR NEGATIVE 09/17/2019 2201   BILIRUBINUR NEGATIVE 09/17/2019 2201   KETONESUR TRACE (A) 09/17/2019 2201   PROTEINUR NEGATIVE 09/17/2019 2201   NITRITE NEGATIVE 09/17/2019 2201   LEUKOCYTESUR NEGATIVE 09/17/2019 2201   Sepsis Labs Invalid input(s): PROCALCITONIN,  WBC,  LACTICIDVEN Microbiology Recent Results (from the past 240 hour(s))  SARS CORONAVIRUS 2 (TAT 6-24 HRS) Nasopharyngeal Nasopharyngeal Swab     Status: None   Collection Time: 09/18/19  3:56 AM   Specimen: Nasopharyngeal Swab  Result Value Ref Range Status   SARS Coronavirus 2 NEGATIVE NEGATIVE Final     Comment: (NOTE) SARS-CoV-2 target nucleic acids are NOT DETECTED. The SARS-CoV-2 RNA is generally detectable in upper and lower respiratory specimens during the acute phase of infection. Negative results do not preclude SARS-CoV-2 infection, do not rule out co-infections with other pathogens, and should not be used as the sole basis for treatment or other patient management decisions. Negative results must be combined with clinical observations, patient history, and epidemiological information. The expected result is Negative. Fact Sheet for Patients: SugarRoll.be Fact Sheet for Healthcare Providers: https://www.woods-mathews.com/ This test is not yet approved or cleared by the [place] FDA and  has been authorized for detection and/or diagnosis of SARS-CoV-2 by FDA under an Emergency Use  Authorization (EUA). This EUA will remain  in effect (meaning this test can be used) for the duration of the COVID-19 declaration under Section 56 4(b)(1) of the Act, 21 U.S.C. section 360bbb-3(b)(1), unless the authorization is terminated or revoked sooner. Performed at [facility], [place] [address], [place], [place] [address]   C difficile quick scan w PCR reflex     Status: None   Collection Time: 09/18/19  6:47 PM   Specimen: STOOL  Result Value Ref Range Status   C Diff antigen NEGATIVE NEGATIVE Final   C Diff toxin NEGATIVE NEGATIVE Final   C Diff interpretation No C. difficile detected.  Final    Comment: Performed at [facility], [place] [address], [place],  [address]     Patient was seen and examined on the day of discharge and was found to be in stable condition. Time coordinating discharge: 35 minutes including assessment and coordination of care, as well as examination of the patient.   SIGNED:  Dessa Phi, DO [facility] 09/20/2019, 10:14 AM

## 2019-09-20 NOTE — Telephone Encounter (Signed)
Called and spoke with patient-patient has been scheduled for a 2 wk post hosp f/u gi bleed/colitis on 10/04/2019 [facility] 3:[facility] with Paula,NP;

## 2019-09-20 NOTE — Telephone Encounter (Signed)
Monique Ellis,  Patient is being discharged from the hospital today. Pls call the patient today to schedule an office follow up appt in 2 weeks with Nevin Bloodgood or Dr. Hilarie Fredrickson, she will need a colonoscopy with Dr. Hilarie Fredrickson in 6 to 8 weeks. Thank you.

## 2019-09-20 NOTE — Plan of Care (Signed)
resolved 

## 2019-09-26 DIAGNOSIS — D62 Acute posthemorrhagic anemia: Secondary | ICD-10-CM | POA: Diagnosis not present

## 2019-09-26 DIAGNOSIS — K921 Melena: Secondary | ICD-10-CM | POA: Diagnosis not present

## 2019-09-26 DIAGNOSIS — E785 Hyperlipidemia, unspecified: Secondary | ICD-10-CM | POA: Diagnosis not present

## 2019-09-26 DIAGNOSIS — K2970 Gastritis, unspecified, without bleeding: Secondary | ICD-10-CM | POA: Diagnosis not present

## 2019-09-26 DIAGNOSIS — K922 Gastrointestinal hemorrhage, unspecified: Secondary | ICD-10-CM | POA: Diagnosis not present

## 2019-09-26 DIAGNOSIS — K559 Vascular disorder of intestine, unspecified: Secondary | ICD-10-CM | POA: Diagnosis not present

## 2019-09-26 DIAGNOSIS — K219 Gastro-esophageal reflux disease without esophagitis: Secondary | ICD-10-CM | POA: Diagnosis not present

## 2019-09-26 DIAGNOSIS — E039 Hypothyroidism, unspecified: Secondary | ICD-10-CM | POA: Diagnosis not present

## 2019-09-27 DIAGNOSIS — H40023 Open angle with borderline findings, high risk, bilateral: Secondary | ICD-10-CM | POA: Diagnosis not present

## 2019-09-27 DIAGNOSIS — H40053 Ocular hypertension, bilateral: Secondary | ICD-10-CM | POA: Diagnosis not present

## 2019-09-27 DIAGNOSIS — H04123 Dry eye syndrome of bilateral lacrimal glands: Secondary | ICD-10-CM | POA: Diagnosis not present

## 2019-09-27 DIAGNOSIS — H2513 Age-related nuclear cataract, bilateral: Secondary | ICD-10-CM | POA: Diagnosis not present

## 2019-10-04 ENCOUNTER — Encounter: Payer: Self-pay | Admitting: Nurse Practitioner

## 2019-10-04 ENCOUNTER — Ambulatory Visit: Payer: Medicare HMO | Admitting: Nurse Practitioner

## 2019-10-04 VITALS — BP 128/74 | HR 92 | Temp 98.5°F | Ht 64.25 in | Wt 151.4 lb

## 2019-10-04 DIAGNOSIS — K529 Noninfective gastroenteritis and colitis, unspecified: Secondary | ICD-10-CM

## 2019-10-04 DIAGNOSIS — K625 Hemorrhage of anus and rectum: Secondary | ICD-10-CM | POA: Diagnosis not present

## 2019-10-04 MED ORDER — NA SULFATE-K SULFATE-MG SULF 17.5-3.13-1.6 GM/177ML PO SOLN: ORAL | 0 refills | Status: DC

## 2019-10-04 NOTE — Progress Notes (Signed)
IMPRESSION and PLAN:    74 year old female with PMH significant for hypothyroidism, GERD, possible , hyperlipidemia.   # Hematochezia / colonic wall thickening on CT angio 09/17/2019 ( transverse, descending, sigmoid and rectum).   --Ischemic colitis possibly but unsure of cause ( no suspicious meds, hypotension, IMA patent on CT angio) --Infectious seems unlikely but not excluded. GI pathogen panel/C. difficile negative --Symptoms have resolved --She has not had a colonoscopy since 2013.  For further evaluation patient will be scheduled for colonoscopy. Patient will be scheduled for a colonoscopy. The risks and benefits of colonoscopy with possible polypectomy / biopsies were discussed and the patient agrees to proceed.   # ? Barrett's esophagus.   --Short segment Barrett's on remote EGD but no histologic evidence for it at least on path report in 2008 and 2013  # Hx of GERD.  --On daily PPI --She asked whether to continue Pepcid which was started in the hospital, possibly for globus --She describes mucus in throat, possibly from sinus drainage.  If no breakthrough GERD symptoms and patient feels confidence that globus is likely from sinus drainage then she can stop Pepcid at nighttime  HPI:    Primary GI: Dr. Hilarie Fredrickson  Chief complaint : Hospital follow-up on hematochezia and abnormal CT scan  Monique Ellis was hospitalized late March with lower abdominal pain and hematochezia.  White count was minimally elevated.  CT angiogram of the abdomen and pelvis showed diffuse colonic wall thickening throughout the transverse colon, left colon and rectum.   She was started on antibiotics . Eventually stools studies resulted and C. difficile negative, GI pathogen panel was negative. Initially we were going to proceed with colonoscopy for further evaluation but this was canceled in favor of performing it as an outpatient with Dr. Hilarie Fredrickson in 6 to 8 weeks.  Monique Ellis is here for follow-up.  It took a  few days for her bowels to become more solid but they have now returned to baseline.  No further blood in stool.  No abdominal pain.  No fevers.  Her appetite is good.   Review of systems:     No chest pain, no SOB, no fevers, no urinary sx   Past Medical History:  Diagnosis Date  . Barrett's esophagus   . GERD (gastroesophageal reflux disease)   . Hyperlipidemia   . Hypothyroidism   . Seasonal allergies     Patient's surgical history, family medical history, social history, medications and allergies were all reviewed in Epic   Serum creatinine: 0.86 mg/dL 09/20/19 0323 Estimated creatinine clearance: 56.7 mL/min  Current Outpatient Medications  Medication Sig Dispense Refill  . aspirin 81 MG tablet Take 81 mg by mouth daily.    . Cholecalciferol (VITAMIN D-3) 5000 UNITS TABS Take by mouth daily.    Marland Kitchen docusate sodium (COLACE) 250 MG capsule Take 250 mg by mouth 2 (two) times daily.    Marland Kitchen esomeprazole (NEXIUM) 40 MG capsule Take 1 capsule (40 mg total) by mouth 2 (two) times daily. 180 capsule 3  . estradiol (VIVELLE-DOT) 0.025 MG/24HR Place 1 patch onto the skin 2 (two) times a week.    . famotidine (PEPCID) 20 MG tablet Take 1 tablet (20 mg total) by mouth at bedtime. 30 tablet 0  . fexofenadine (ALLEGRA) 180 MG tablet Take 180 mg by mouth as needed.     Marland Kitchen levothyroxine (SYNTHROID, LEVOTHROID) 75 MCG tablet Take 75 mcg by mouth daily.    Marland Kitchen  loratadine (CLARITIN) 10 MG tablet Take 10 mg by mouth as needed for allergies.     . simvastatin (ZOCOR) 40 MG tablet Take 40 mg by mouth daily.     No current facility-administered medications for this visit.    Physical Exam:     BP 128/74 (BP Location: Left Arm, Patient Position: Sitting, Cuff Size: Normal)   Pulse 92   Temp 98.5 F (36.9 C)   Ht 5' 4.25" (1.632 m) Comment: height measured without shoes  Wt 151 lb 6 oz (68.7 kg)   BMI 25.78 kg/m   GENERAL:  Pleasant female in NAD PSYCH: : Cooperative, normal affect CARDIAC:  RRR,  no murmur heard, no peripheral edema PULM: Normal respiratory effort, lungs CTA bilaterally, no wheezing ABDOMEN:  Nondistended, soft, nontender. No obvious masses, no hepatomegaly,  normal bowel sounds SKIN:  turgor, no lesions seen Musculoskeletal:  Normal muscle tone, normal strength NEURO: Alert and oriented x 3, no focal neurologic deficits   Monique Ellis , NP 10/04/2019, 3:44 PM

## 2019-10-04 NOTE — Patient Instructions (Signed)
If you are age 72 or older, your body mass index should be between 23-30. Your Body mass index is 25.78 kg/m. If this is out of the aforementioned range listed, please consider follow up with your Primary Care Provider.  If you are age 74 or younger, your body mass index should be between 19-25. Your Body mass index is 25.78 kg/m. If this is out of the aformentioned range listed, please consider follow up with your Primary Care Provider.   You have been scheduled for a colonoscopy. Please follow written instructions given to you at your visit today.  Please pick up your prep supplies at the pharmacy within the next 1-3 days. If you use inhalers (even only as needed), please bring them with you on the day of your procedure. Your physician has requested that you go to www.startemmi.com and enter the access code given to you at your visit today. This web site gives a general overview about your procedure. However, you should still follow specific instructions given to you by our office regarding your preparation for the procedure.  We have sent the following medications to your pharmacy for you to pick up at your convenience: Suprep  Thank you for choosing me and [facility].   Tye Savoy, NP

## 2019-10-04 NOTE — Progress Notes (Signed)
Addendum: Reviewed and agree with assessment and management plan. Deni Lefever M, MD  

## 2019-10-17 ENCOUNTER — Telehealth: Payer: Self-pay | Admitting: Nurse Practitioner

## 2019-10-17 MED ORDER — SUTAB 1479-225-188 MG PO TABS: 1.0000 | ORAL_TABLET | ORAL | 0 refills | Status: DC

## 2019-10-17 NOTE — Telephone Encounter (Signed)
Spoke with patient and advised that Suprep would be switched with Sutab and a discount card would be sent to her pharmacy as well to help with cost. I advised that I would send new directions for her colon prep to her through her MyChart.

## 2019-11-07 DIAGNOSIS — M79642 Pain in left hand: Secondary | ICD-10-CM | POA: Insufficient documentation

## 2019-11-08 DIAGNOSIS — M13832 Other specified arthritis, left wrist: Secondary | ICD-10-CM | POA: Diagnosis not present

## 2019-11-08 DIAGNOSIS — M79642 Pain in left hand: Secondary | ICD-10-CM | POA: Diagnosis not present

## 2019-11-09 ENCOUNTER — Encounter: Payer: Self-pay | Admitting: Internal Medicine

## 2019-11-17 ENCOUNTER — Ambulatory Visit (AMBULATORY_SURGERY_CENTER): Payer: Medicare HMO | Admitting: Internal Medicine

## 2019-11-17 ENCOUNTER — Encounter: Payer: Self-pay | Admitting: Internal Medicine

## 2019-11-17 ENCOUNTER — Other Ambulatory Visit: Payer: Self-pay

## 2019-11-17 VITALS — BP 137/68 | HR 71 | Temp 96.6°F | Resp 11 | Ht 64.25 in | Wt 151.0 lb

## 2019-11-17 DIAGNOSIS — R933 Abnormal findings on diagnostic imaging of other parts of digestive tract: Secondary | ICD-10-CM

## 2019-11-17 DIAGNOSIS — E785 Hyperlipidemia, unspecified: Secondary | ICD-10-CM | POA: Diagnosis not present

## 2019-11-17 DIAGNOSIS — D125 Benign neoplasm of sigmoid colon: Secondary | ICD-10-CM

## 2019-11-17 DIAGNOSIS — K219 Gastro-esophageal reflux disease without esophagitis: Secondary | ICD-10-CM | POA: Diagnosis not present

## 2019-11-17 DIAGNOSIS — K529 Noninfective gastroenteritis and colitis, unspecified: Secondary | ICD-10-CM | POA: Diagnosis not present

## 2019-11-17 DIAGNOSIS — K649 Unspecified hemorrhoids: Secondary | ICD-10-CM

## 2019-11-17 DIAGNOSIS — K921 Melena: Secondary | ICD-10-CM | POA: Diagnosis not present

## 2019-11-17 DIAGNOSIS — K625 Hemorrhage of anus and rectum: Secondary | ICD-10-CM | POA: Diagnosis not present

## 2019-11-17 MED ORDER — SODIUM CHLORIDE 0.9 % IV SOLN: 500.0000 mL | Freq: Once | INTRAVENOUS | Status: AC

## 2019-11-17 NOTE — Progress Notes (Signed)
Called to room to assist during endoscopic procedure.  Patient ID and intended procedure confirmed with present staff. Received instructions for my participation in the procedure from the performing physician.  

## 2019-11-17 NOTE — Op Note (Signed)
[place] Patient Name: Monique Ellis Procedure Date: 11/17/2019 3:50 PM MRN: [id number] Endoscopist: Jerene Bears , MD Age: 74 Referring MD:  Date of Birth: [birth year] Gender: Female Account #: [id number] Procedure:                Colonoscopy Indications:              Hematochezia and Abnormal CT of the GI tract (both                            now resolved); last colonoscopy normal March 2013 Medicines:                Monitored Anesthesia Care Procedure:                Pre-Anesthesia Assessment:                           - Prior to the procedure, a History and Physical                            was performed, and patient medications and                            allergies were reviewed. The patient's tolerance of                            previous anesthesia was also reviewed. The risks                            and benefits of the procedure and the sedation                            options and risks were discussed with the patient.                            All questions were answered, and informed consent                            was obtained. Prior Anticoagulants: The patient has                            taken no previous anticoagulant or antiplatelet                            agents. ASA Grade Assessment: II - A patient with                            mild systemic disease. After reviewing the risks                            and benefits, the patient was deemed in                            satisfactory condition to undergo the procedure.  After obtaining informed consent, the colonoscope                            was passed under direct vision. Throughout the                            procedure, the patient's blood pressure, pulse, and                            oxygen saturations were monitored continuously. The                            Colonoscope was introduced through the anus and                            advanced to the  cecum, identified by appendiceal                            orifice and ileocecal valve. The colonoscopy was                            performed without difficulty. The patient tolerated                            the procedure well. The quality of the bowel                            preparation was good. The ileocecal valve,                            appendiceal orifice, and rectum were photographed. Scope In: 4:08:16 PM Scope Out: 4:24:14 PM Scope Withdrawal Time: 0 hours 9 minutes 25 seconds  Total Procedure Duration: 0 hours 15 minutes 58 seconds  Findings:                 The digital rectal exam was normal.                           A 5 mm polyp was found in the sigmoid colon. The                            polyp was sessile. The polyp was removed with a                            cold snare. Resection and retrieval were complete.                           Internal hemorrhoids were found during                            retroflexion. The hemorrhoids were small.                           The exam was otherwise without abnormality. Complications:  No immediate complications. Estimated Blood Loss:     Estimated blood loss was minimal. Impression:               - One 5 mm polyp in the sigmoid colon, removed with                            a cold snare. Resected and retrieved.                           - Small internal hemorrhoids.                           - The examination was otherwise normal. Likely the                            abnormality seen by CT in March 2021 and associated                            rectal bleeding was secondary to an episode of now                            resolved ischemic colitis. Recommendation:           - Patient has a contact number available for                            emergencies. The signs and symptoms of potential                            delayed complications were discussed with the                            patient. Return  to normal activities tomorrow.                            Written discharge instructions were provided to the                            patient.                           - Resume previous diet.                           - Continue present medications.                           - Await pathology results.                           - No recommendation at this time regarding repeat                            colonoscopy. Jerene Bears, MD 11/17/2019 4:30:40 PM This report has been signed electronically.

## 2019-11-17 NOTE — Progress Notes (Signed)
sr- checked in Monique Ellis - vs

## 2019-11-17 NOTE — Patient Instructions (Signed)
Handouts Provided:  Polyps  YOU HAD AN ENDOSCOPIC PROCEDURE TODAY AT THE  ENDOSCOPY CENTER:   Refer to the procedure report that was given to you for any specific questions about what was found during the examination.  If the procedure report does not answer your questions, please call your gastroenterologist to clarify.  If you requested that your care partner not be given the details of your procedure findings, then the procedure report has been included in a sealed envelope for you to review at your convenience later.  YOU SHOULD EXPECT: Some feelings of bloating in the abdomen. Passage of more gas than usual.  Walking can help get rid of the air that was put into your GI tract during the procedure and reduce the bloating. If you had a lower endoscopy (such as a colonoscopy or flexible sigmoidoscopy) you may notice spotting of blood in your stool or on the toilet paper. If you underwent a bowel prep for your procedure, you may not have a normal bowel movement for a few days.  Please Note:  You might notice some irritation and congestion in your nose or some drainage.  This is from the oxygen used during your procedure.  There is no need for concern and it should clear up in a day or so.  SYMPTOMS TO REPORT IMMEDIATELY:   Following lower endoscopy (colonoscopy or flexible sigmoidoscopy):  Excessive amounts of blood in the stool  Significant tenderness or worsening of abdominal pains  Swelling of the abdomen that is new, acute  Fever of 100F or higher  For urgent or emergent issues, a gastroenterologist can be reached at any hour by calling [phone]. Do not use MyChart messaging for urgent concerns.    DIET:  We do recommend a small meal at first, but then you may proceed to your regular diet.  Drink plenty of fluids but you should avoid alcoholic beverages for 24 hours.  ACTIVITY:  You should plan to take it easy for the rest of today and you should NOT DRIVE or use heavy  machinery until tomorrow (because of the sedation medicines used during the test).    FOLLOW UP: Our staff will call the number listed on your records 48-72 hours following your procedure to check on you and address any questions or concerns that you may have regarding the information given to you following your procedure. If we do not reach you, we will leave a message.  We will attempt to reach you two times.  During this call, we will ask if you have developed any symptoms of COVID 19. If you develop any symptoms (ie: fever, flu-like symptoms, shortness of breath, cough etc.) before then, please call [phone].  If you test positive for Covid 19 in the 2 weeks post procedure, please call and report this information to us.    If any biopsies were taken you will be contacted by phone or by letter within the next 1-3 weeks.  Please call us at [phone] if you have not heard about the biopsies in 3 weeks.    SIGNATURES/CONFIDENTIALITY: You and/or your care partner have signed paperwork which will be entered into your electronic medical record.  These signatures attest to the fact that that the information above on your After Visit Summary has been reviewed and is understood.  Full responsibility of the confidentiality of this discharge information lies with you and/or your care-partner.  

## 2019-11-17 NOTE — Progress Notes (Signed)
Report to PACU, RN, vss, BBS= Clear.  

## 2019-11-21 ENCOUNTER — Telehealth: Payer: Self-pay

## 2019-11-21 NOTE — Telephone Encounter (Signed)
  Follow up Call-  Call back number 11/17/2019  Post procedure Call Back phone  # [phone] cell  Permission to leave phone message No  Some recent data might be hidden     Patient questions:  Do you have a fever, pain , or abdominal swelling? No. Pain Score  0 *  Have you tolerated food without any problems? Yes.    Have you been able to return to your normal activities? Yes.    Do you have any questions about your discharge instructions: Diet   No. Medications  No. Follow up visit  No.  Do you have questions or concerns about your Care? No.  Actions: * If pain score is 4 or above: No action needed, pain <4.  1. Have you developed a fever since your procedure? no  2.   Have you had an respiratory symptoms (SOB or cough) since your procedure? no  3.   Have you tested positive for COVID 19 since your procedure no  4.   Have you had any family members/close contacts diagnosed with the COVID 19 since your procedure?  no   If yes to any of these questions please route to Joylene John, RN and Erenest Rasher, RN

## 2019-11-22 ENCOUNTER — Encounter: Payer: Self-pay | Admitting: Internal Medicine

## 2020-02-21 DIAGNOSIS — H40023 Open angle with borderline findings, high risk, bilateral: Secondary | ICD-10-CM | POA: Diagnosis not present

## 2020-02-21 DIAGNOSIS — H40053 Ocular hypertension, bilateral: Secondary | ICD-10-CM | POA: Diagnosis not present

## 2020-02-21 DIAGNOSIS — H2513 Age-related nuclear cataract, bilateral: Secondary | ICD-10-CM | POA: Diagnosis not present

## 2020-02-21 DIAGNOSIS — H04123 Dry eye syndrome of bilateral lacrimal glands: Secondary | ICD-10-CM | POA: Diagnosis not present

## 2020-02-29 DIAGNOSIS — H40053 Ocular hypertension, bilateral: Secondary | ICD-10-CM | POA: Diagnosis not present

## 2020-02-29 DIAGNOSIS — H40023 Open angle with borderline findings, high risk, bilateral: Secondary | ICD-10-CM | POA: Diagnosis not present

## 2020-02-29 DIAGNOSIS — H2513 Age-related nuclear cataract, bilateral: Secondary | ICD-10-CM | POA: Diagnosis not present

## 2020-02-29 DIAGNOSIS — H04123 Dry eye syndrome of bilateral lacrimal glands: Secondary | ICD-10-CM | POA: Diagnosis not present

## 2020-03-05 DIAGNOSIS — H00024 Hordeolum internum left upper eyelid: Secondary | ICD-10-CM | POA: Diagnosis not present

## 2020-03-19 DIAGNOSIS — R69 Illness, unspecified: Secondary | ICD-10-CM | POA: Diagnosis not present

## 2020-03-26 DIAGNOSIS — E039 Hypothyroidism, unspecified: Secondary | ICD-10-CM | POA: Diagnosis not present

## 2020-03-26 DIAGNOSIS — R7301 Impaired fasting glucose: Secondary | ICD-10-CM | POA: Diagnosis not present

## 2020-03-26 DIAGNOSIS — M859 Disorder of bone density and structure, unspecified: Secondary | ICD-10-CM | POA: Diagnosis not present

## 2020-03-26 DIAGNOSIS — E785 Hyperlipidemia, unspecified: Secondary | ICD-10-CM | POA: Diagnosis not present

## 2020-04-02 DIAGNOSIS — R82998 Other abnormal findings in urine: Secondary | ICD-10-CM | POA: Diagnosis not present

## 2020-04-02 DIAGNOSIS — Z0000 Encounter for general adult medical examination without abnormal findings: Secondary | ICD-10-CM | POA: Diagnosis not present

## 2020-04-02 DIAGNOSIS — E785 Hyperlipidemia, unspecified: Secondary | ICD-10-CM | POA: Diagnosis not present

## 2020-04-02 DIAGNOSIS — I10 Essential (primary) hypertension: Secondary | ICD-10-CM | POA: Diagnosis not present

## 2020-04-02 DIAGNOSIS — K219 Gastro-esophageal reflux disease without esophagitis: Secondary | ICD-10-CM | POA: Diagnosis not present

## 2020-04-02 DIAGNOSIS — Z23 Encounter for immunization: Secondary | ICD-10-CM | POA: Diagnosis not present

## 2020-04-02 DIAGNOSIS — E039 Hypothyroidism, unspecified: Secondary | ICD-10-CM | POA: Diagnosis not present

## 2020-04-05 DIAGNOSIS — K921 Melena: Secondary | ICD-10-CM | POA: Diagnosis not present

## 2020-04-09 DIAGNOSIS — R69 Illness, unspecified: Secondary | ICD-10-CM | POA: Diagnosis not present

## 2020-05-29 DIAGNOSIS — H04123 Dry eye syndrome of bilateral lacrimal glands: Secondary | ICD-10-CM | POA: Diagnosis not present

## 2020-05-29 DIAGNOSIS — H2513 Age-related nuclear cataract, bilateral: Secondary | ICD-10-CM | POA: Diagnosis not present

## 2020-05-29 DIAGNOSIS — H40023 Open angle with borderline findings, high risk, bilateral: Secondary | ICD-10-CM | POA: Diagnosis not present

## 2020-05-29 DIAGNOSIS — H40053 Ocular hypertension, bilateral: Secondary | ICD-10-CM | POA: Diagnosis not present

## 2020-07-03 ENCOUNTER — Other Ambulatory Visit: Payer: Self-pay

## 2020-07-03 ENCOUNTER — Other Ambulatory Visit: Payer: Medicare HMO

## 2020-07-03 DIAGNOSIS — Z20822 Contact with and (suspected) exposure to covid-19: Secondary | ICD-10-CM | POA: Diagnosis not present

## 2020-07-06 LAB — NOVEL CORONAVIRUS, NAA: SARS-CoV-2, NAA: NOT DETECTED

## 2020-07-25 DIAGNOSIS — D225 Melanocytic nevi of trunk: Secondary | ICD-10-CM | POA: Diagnosis not present

## 2020-07-25 DIAGNOSIS — D2271 Melanocytic nevi of right lower limb, including hip: Secondary | ICD-10-CM | POA: Diagnosis not present

## 2020-07-25 DIAGNOSIS — L821 Other seborrheic keratosis: Secondary | ICD-10-CM | POA: Diagnosis not present

## 2020-07-25 DIAGNOSIS — D485 Neoplasm of uncertain behavior of skin: Secondary | ICD-10-CM | POA: Diagnosis not present

## 2020-07-25 DIAGNOSIS — D2272 Melanocytic nevi of left lower limb, including hip: Secondary | ICD-10-CM | POA: Diagnosis not present

## 2020-07-25 DIAGNOSIS — L820 Inflamed seborrheic keratosis: Secondary | ICD-10-CM | POA: Diagnosis not present

## 2020-07-25 DIAGNOSIS — L814 Other melanin hyperpigmentation: Secondary | ICD-10-CM | POA: Diagnosis not present

## 2020-08-05 DIAGNOSIS — H40053 Ocular hypertension, bilateral: Secondary | ICD-10-CM | POA: Diagnosis not present

## 2020-08-19 DIAGNOSIS — Z1231 Encounter for screening mammogram for malignant neoplasm of breast: Secondary | ICD-10-CM | POA: Diagnosis not present

## 2020-08-21 DIAGNOSIS — Z1272 Encounter for screening for malignant neoplasm of vagina: Secondary | ICD-10-CM | POA: Diagnosis not present

## 2020-08-21 DIAGNOSIS — Z90710 Acquired absence of both cervix and uterus: Secondary | ICD-10-CM | POA: Diagnosis not present

## 2020-08-21 DIAGNOSIS — Z124 Encounter for screening for malignant neoplasm of cervix: Secondary | ICD-10-CM | POA: Diagnosis not present

## 2020-08-21 DIAGNOSIS — Z6826 Body mass index (BMI) 26.0-26.9, adult: Secondary | ICD-10-CM | POA: Diagnosis not present

## 2020-08-21 DIAGNOSIS — E785 Hyperlipidemia, unspecified: Secondary | ICD-10-CM | POA: Insufficient documentation

## 2020-08-26 DIAGNOSIS — H40053 Ocular hypertension, bilateral: Secondary | ICD-10-CM | POA: Diagnosis not present

## 2020-08-26 DIAGNOSIS — H40023 Open angle with borderline findings, high risk, bilateral: Secondary | ICD-10-CM | POA: Diagnosis not present

## 2020-10-07 DIAGNOSIS — J329 Chronic sinusitis, unspecified: Secondary | ICD-10-CM | POA: Diagnosis not present

## 2020-10-07 DIAGNOSIS — Z809 Family history of malignant neoplasm, unspecified: Secondary | ICD-10-CM | POA: Diagnosis not present

## 2020-10-14 DIAGNOSIS — H40053 Ocular hypertension, bilateral: Secondary | ICD-10-CM | POA: Diagnosis not present

## 2020-10-14 DIAGNOSIS — H40023 Open angle with borderline findings, high risk, bilateral: Secondary | ICD-10-CM | POA: Diagnosis not present

## 2021-02-17 DIAGNOSIS — H04123 Dry eye syndrome of bilateral lacrimal glands: Secondary | ICD-10-CM | POA: Diagnosis not present

## 2021-02-17 DIAGNOSIS — H40003 Preglaucoma, unspecified, bilateral: Secondary | ICD-10-CM | POA: Diagnosis not present

## 2021-02-25 IMAGING — CT CT CTA ABD/PEL W/CM AND/OR W/O CM
3 of 12 series · 12 of 46 positions shown, 17 images · IV contrast (APPLIED)
Comparison: None.

CLINICAL DATA: Bright red blood per rectum.

EXAM:
CTA ABDOMEN AND PELVIS WITHOUT AND WITH CONTRAST
TECHNIQUE: Multidetector CT imaging of the abdomen and pelvis was performed
using the standard protocol during bolus administration of
intravenous contrast. Multiplanar reconstructed images and MIPs were
obtained and reviewed to evaluate the vascular anatomy.
CONTRAST:  100mL OMNIPAQUE IOHEXOL 350 MG/ML SOLN

[Series 5: arterial · axial · arterial · 0.70mm/px · z∈[-467,-97]mm · 8 of 233 slices shown]
[im 24/233  soft-tissue]
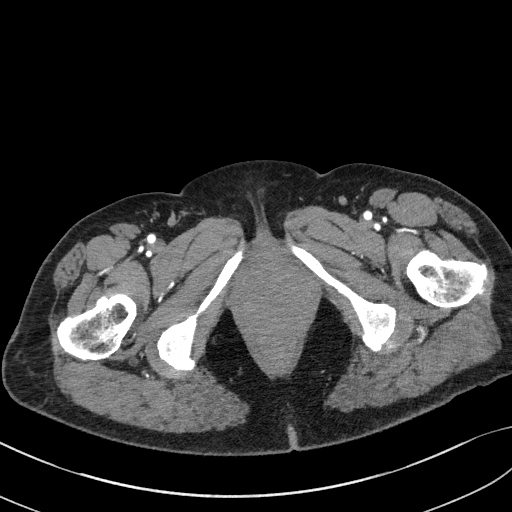
[im 47/233  soft-tissue]
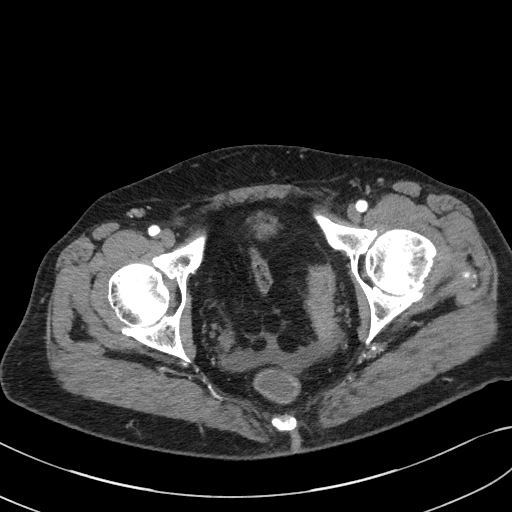
[im 70/233  soft-tissue]
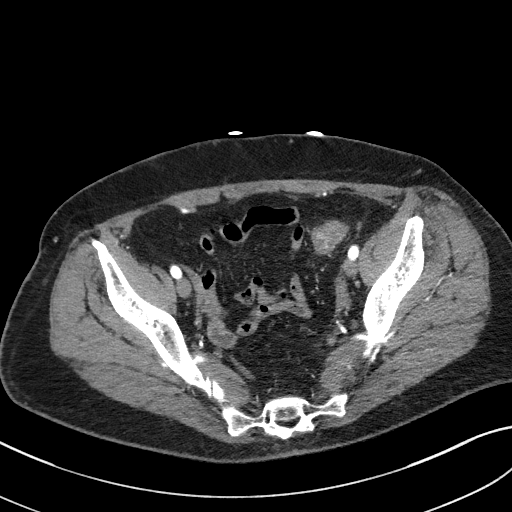
[im 93/233  soft-tissue]
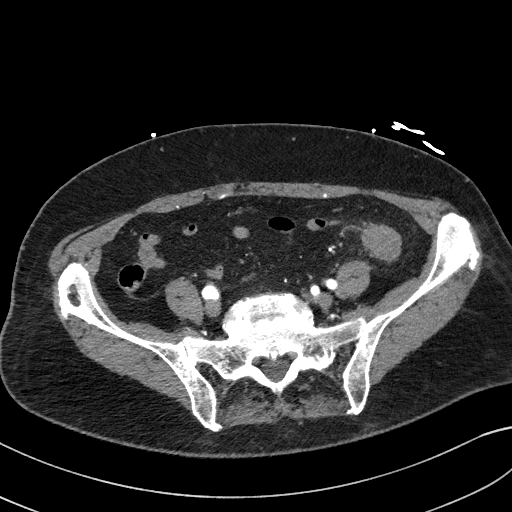
[im 140/233  soft-tissue]
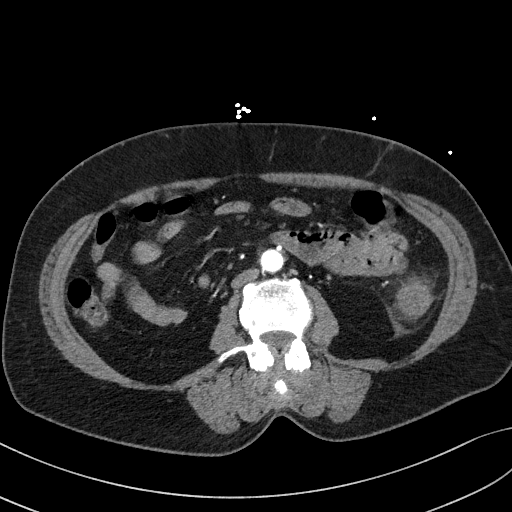
[im 163/233  soft-tissue]
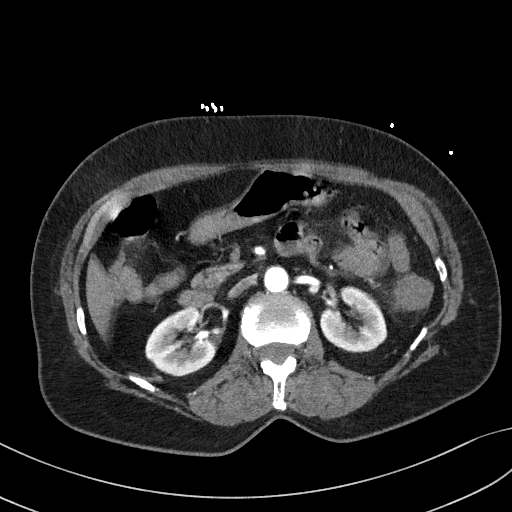
[im 186/233  soft-tissue]
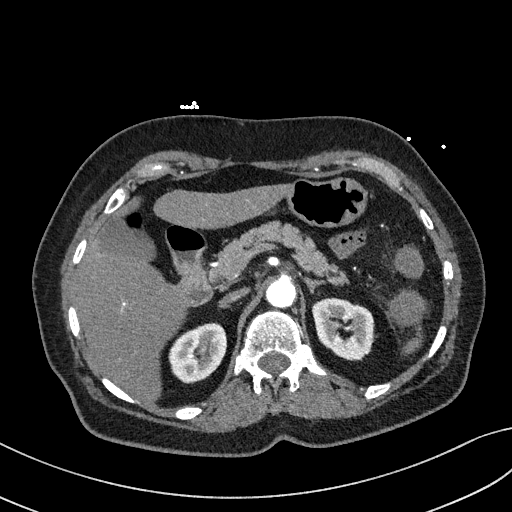
[im 209/233  soft-tissue]
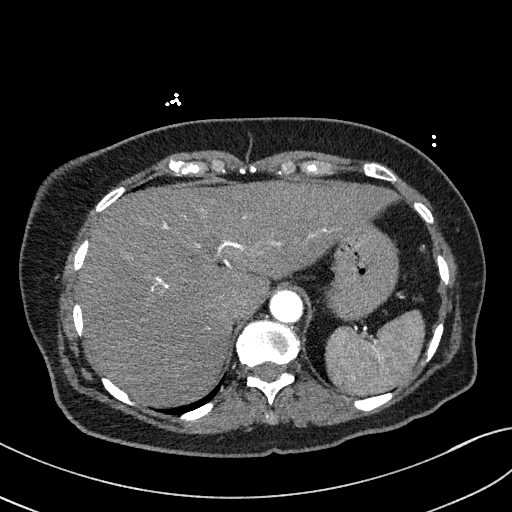

[Series 8: portal venous · axial · portal-venous · 0.70mm/px · z∈[-399,-169]mm · 3 of 94 slices shown, 7 images]
[im 24/94  soft-tissue]
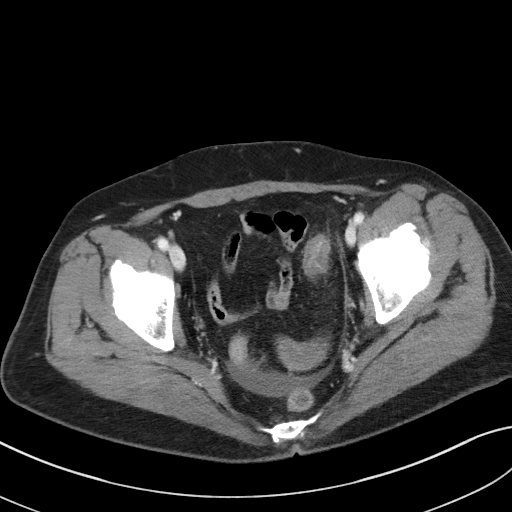
[im 24/94  lung]
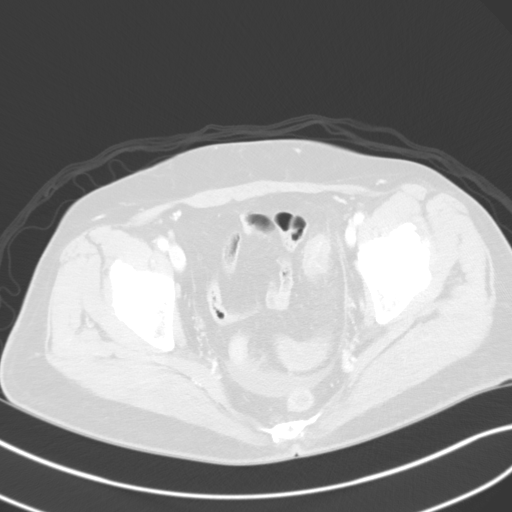
[im 24/94  bone]
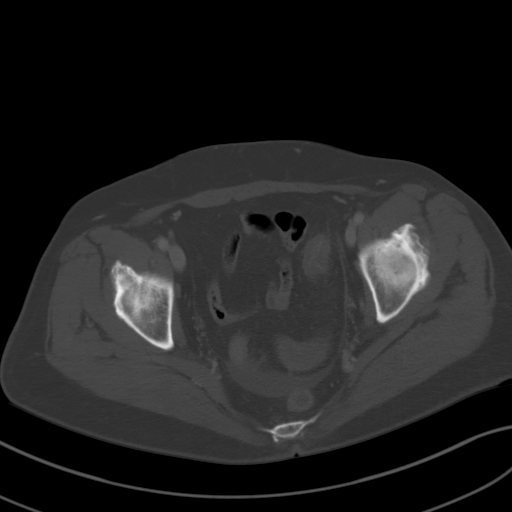
[im 47/94  soft-tissue]
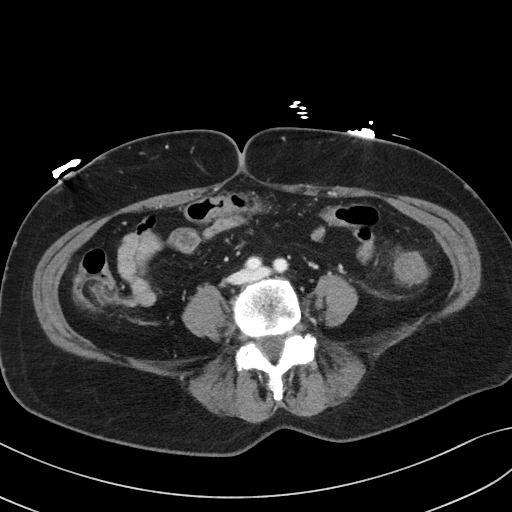
[im 47/94  lung]
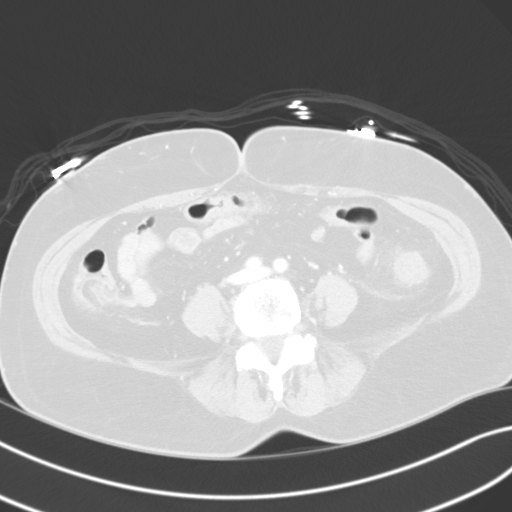
[im 70/94  soft-tissue]
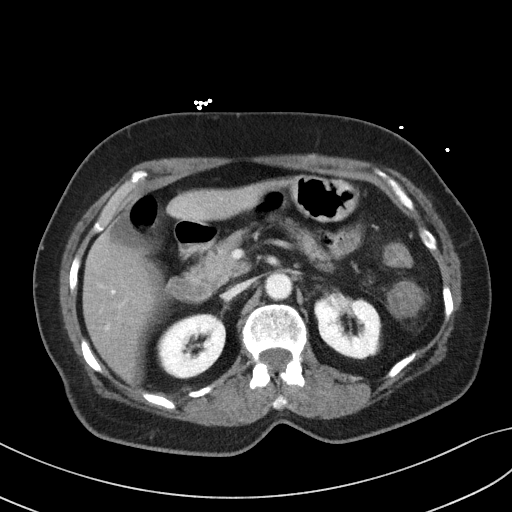
[im 70/94  lung]
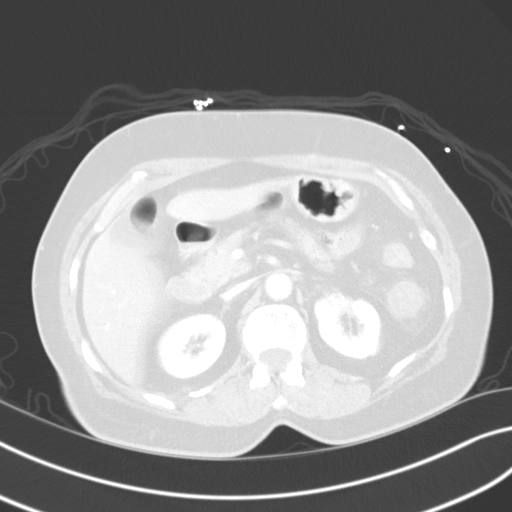

[Series 16: coronal art · coronal · 0.74mm/px · 1 of 136 slices shown, 2 images]
[im 68/136  soft-tissue]
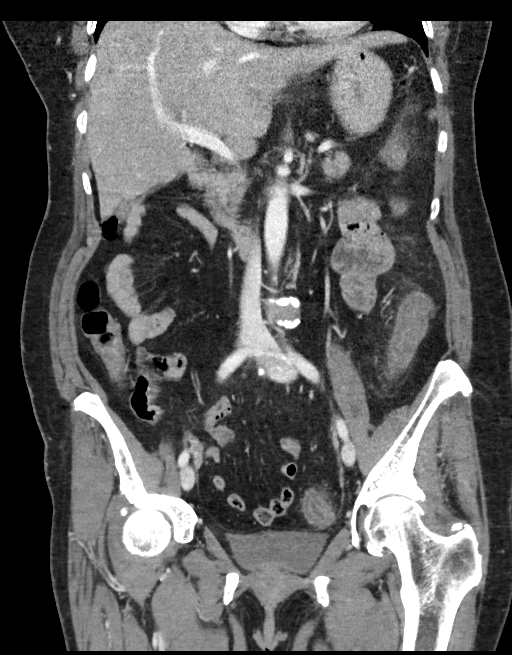
[im 68/136  bone]
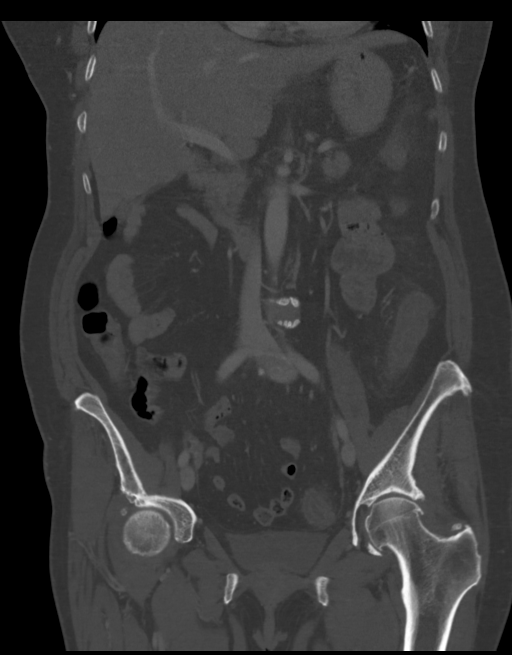

[12 of 46 positions shown; findings below may reference images not displayed]

FINDINGS: VASCULAR

Aorta: Normal caliber aorta without aneurysm, dissection, vasculitis
or significant stenosis.

Celiac: There is mild narrowing at the origin of the celiac axis.
There is normal hepatic arterial anatomy. A prominent supraduodenal
artery is noted.

SMA: Patent without evidence of aneurysm, dissection, vasculitis or
significant stenosis.

Renals: Both renal arteries are patent without evidence of aneurysm,
dissection, vasculitis, fibromuscular dysplasia or significant
stenosis.

IMA: Patent without evidence of aneurysm, dissection, vasculitis or
significant stenosis.

Inflow: Patent without evidence of aneurysm, dissection, vasculitis
or significant stenosis.

Proximal Outflow: Bilateral common femoral and visualized portions
of the superficial and profunda femoral arteries are patent without
evidence of aneurysm, dissection, vasculitis or significant
stenosis.

Veins: No obvious venous abnormality within the limitations of this
arterial phase study.

Review of the MIP images confirms the above findings.

NON-VASCULAR

Lower chest: The lung bases are clear. The heart size is normal.

Hepatobiliary: There is decreased hepatic attenuation suggestive of
hepatic steatosis. Normal gallbladder.There is no biliary ductal
dilation.

Pancreas: Normal contours without ductal dilatation. No
peripancreatic fluid collection.

Spleen: No splenic laceration or hematoma.

Adrenals/Urinary Tract:

--Adrenal glands: No adrenal hemorrhage.

--Right kidney/ureter: No hydronephrosis or perinephric hematoma.

--Left kidney/ureter: No hydronephrosis or perinephric hematoma.

--Urinary bladder: Unremarkable.

Stomach/Bowel:

--Stomach/Duodenum: No hiatal hernia or other gastric abnormality.
Normal duodenal course and caliber.

--Small bowel: No dilatation or inflammation.

--Colon: There is diffuse colonic wall thickening involving the
transverse, descending, sigmoid colon and rectum. There is no
evidence for active arterial extravasation.

--Appendix: Normal.

Lymphatic:

--No retroperitoneal lymphadenopathy.

--No mesenteric lymphadenopathy.

--No pelvic or inguinal lymphadenopathy.

Reproductive: Status post hysterectomy. No adnexal mass.

Other: There is a small amount of free fluid in the patient's
pelvis, likely reactive. The abdominal wall is normal.

Musculoskeletal. No acute displaced fractures.
IMPRESSION: 1. Diffuse colonic wall thickening as detailed above favored to be
secondary to infectious or inflammatory colitis. There is no
evidence for active contrast extravasation into the bowel. There is
no bowel obstruction.
2. Small amount of free fluid in the patient's pelvis, likely
reactive.
3. Hepatic steatosis.

Aortic Atherosclerosis (B5GLE-LQV.V).

## 2021-03-14 DIAGNOSIS — M65341 Trigger finger, right ring finger: Secondary | ICD-10-CM | POA: Diagnosis not present

## 2021-03-26 DIAGNOSIS — H409 Unspecified glaucoma: Secondary | ICD-10-CM | POA: Diagnosis not present

## 2021-03-26 DIAGNOSIS — Z780 Asymptomatic menopausal state: Secondary | ICD-10-CM | POA: Diagnosis not present

## 2021-03-26 DIAGNOSIS — Z008 Encounter for other general examination: Secondary | ICD-10-CM | POA: Diagnosis not present

## 2021-03-26 DIAGNOSIS — J309 Allergic rhinitis, unspecified: Secondary | ICD-10-CM | POA: Diagnosis not present

## 2021-03-26 DIAGNOSIS — M8580 Other specified disorders of bone density and structure, unspecified site: Secondary | ICD-10-CM | POA: Diagnosis not present

## 2021-03-26 DIAGNOSIS — R030 Elevated blood-pressure reading, without diagnosis of hypertension: Secondary | ICD-10-CM | POA: Diagnosis not present

## 2021-03-26 DIAGNOSIS — E039 Hypothyroidism, unspecified: Secondary | ICD-10-CM | POA: Diagnosis not present

## 2021-03-26 DIAGNOSIS — Z7982 Long term (current) use of aspirin: Secondary | ICD-10-CM | POA: Diagnosis not present

## 2021-03-26 DIAGNOSIS — Z7722 Contact with and (suspected) exposure to environmental tobacco smoke (acute) (chronic): Secondary | ICD-10-CM | POA: Diagnosis not present

## 2021-03-26 DIAGNOSIS — E785 Hyperlipidemia, unspecified: Secondary | ICD-10-CM | POA: Diagnosis not present

## 2021-03-26 DIAGNOSIS — K219 Gastro-esophageal reflux disease without esophagitis: Secondary | ICD-10-CM | POA: Diagnosis not present

## 2021-04-07 DIAGNOSIS — N390 Urinary tract infection, site not specified: Secondary | ICD-10-CM | POA: Diagnosis not present

## 2021-04-07 DIAGNOSIS — R300 Dysuria: Secondary | ICD-10-CM | POA: Diagnosis not present

## 2021-04-15 DIAGNOSIS — M859 Disorder of bone density and structure, unspecified: Secondary | ICD-10-CM | POA: Diagnosis not present

## 2021-04-15 DIAGNOSIS — E039 Hypothyroidism, unspecified: Secondary | ICD-10-CM | POA: Diagnosis not present

## 2021-04-15 DIAGNOSIS — R7301 Impaired fasting glucose: Secondary | ICD-10-CM | POA: Diagnosis not present

## 2021-04-15 DIAGNOSIS — E785 Hyperlipidemia, unspecified: Secondary | ICD-10-CM | POA: Diagnosis not present

## 2021-04-22 DIAGNOSIS — E039 Hypothyroidism, unspecified: Secondary | ICD-10-CM | POA: Diagnosis not present

## 2021-04-22 DIAGNOSIS — Z1331 Encounter for screening for depression: Secondary | ICD-10-CM | POA: Diagnosis not present

## 2021-04-22 DIAGNOSIS — K219 Gastro-esophageal reflux disease without esophagitis: Secondary | ICD-10-CM | POA: Diagnosis not present

## 2021-04-22 DIAGNOSIS — Z23 Encounter for immunization: Secondary | ICD-10-CM | POA: Diagnosis not present

## 2021-04-22 DIAGNOSIS — E785 Hyperlipidemia, unspecified: Secondary | ICD-10-CM | POA: Diagnosis not present

## 2021-04-22 DIAGNOSIS — Z1339 Encounter for screening examination for other mental health and behavioral disorders: Secondary | ICD-10-CM | POA: Diagnosis not present

## 2021-04-22 DIAGNOSIS — I10 Essential (primary) hypertension: Secondary | ICD-10-CM | POA: Diagnosis not present

## 2021-04-22 DIAGNOSIS — G4700 Insomnia, unspecified: Secondary | ICD-10-CM | POA: Diagnosis not present

## 2021-04-22 DIAGNOSIS — R7303 Prediabetes: Secondary | ICD-10-CM | POA: Diagnosis not present

## 2021-04-22 DIAGNOSIS — Z0000 Encounter for general adult medical examination without abnormal findings: Secondary | ICD-10-CM | POA: Diagnosis not present

## 2021-05-19 DIAGNOSIS — M65341 Trigger finger, right ring finger: Secondary | ICD-10-CM | POA: Diagnosis not present

## 2021-05-20 DIAGNOSIS — R0981 Nasal congestion: Secondary | ICD-10-CM | POA: Diagnosis not present

## 2021-05-20 DIAGNOSIS — J0100 Acute maxillary sinusitis, unspecified: Secondary | ICD-10-CM | POA: Diagnosis not present

## 2021-05-20 DIAGNOSIS — I10 Essential (primary) hypertension: Secondary | ICD-10-CM | POA: Diagnosis not present

## 2021-05-20 DIAGNOSIS — Z1152 Encounter for screening for COVID-19: Secondary | ICD-10-CM | POA: Diagnosis not present

## 2021-05-20 DIAGNOSIS — J029 Acute pharyngitis, unspecified: Secondary | ICD-10-CM | POA: Diagnosis not present

## 2021-05-20 DIAGNOSIS — R5383 Other fatigue: Secondary | ICD-10-CM | POA: Diagnosis not present

## 2021-07-24 DIAGNOSIS — H04123 Dry eye syndrome of bilateral lacrimal glands: Secondary | ICD-10-CM | POA: Diagnosis not present

## 2021-07-24 DIAGNOSIS — H40003 Preglaucoma, unspecified, bilateral: Secondary | ICD-10-CM | POA: Diagnosis not present

## 2021-07-24 DIAGNOSIS — H5711 Ocular pain, right eye: Secondary | ICD-10-CM | POA: Diagnosis not present

## 2021-07-29 DIAGNOSIS — D225 Melanocytic nevi of trunk: Secondary | ICD-10-CM | POA: Diagnosis not present

## 2021-07-29 DIAGNOSIS — D2272 Melanocytic nevi of left lower limb, including hip: Secondary | ICD-10-CM | POA: Diagnosis not present

## 2021-07-29 DIAGNOSIS — D2271 Melanocytic nevi of right lower limb, including hip: Secondary | ICD-10-CM | POA: Diagnosis not present

## 2021-07-29 DIAGNOSIS — D235 Other benign neoplasm of skin of trunk: Secondary | ICD-10-CM | POA: Diagnosis not present

## 2021-07-29 DIAGNOSIS — L820 Inflamed seborrheic keratosis: Secondary | ICD-10-CM | POA: Diagnosis not present

## 2021-07-29 DIAGNOSIS — D1723 Benign lipomatous neoplasm of skin and subcutaneous tissue of right leg: Secondary | ICD-10-CM | POA: Diagnosis not present

## 2021-07-29 DIAGNOSIS — L821 Other seborrheic keratosis: Secondary | ICD-10-CM | POA: Diagnosis not present

## 2021-08-08 DIAGNOSIS — M65341 Trigger finger, right ring finger: Secondary | ICD-10-CM | POA: Diagnosis not present

## 2021-08-20 DIAGNOSIS — H2513 Age-related nuclear cataract, bilateral: Secondary | ICD-10-CM | POA: Diagnosis not present

## 2021-08-20 DIAGNOSIS — H04123 Dry eye syndrome of bilateral lacrimal glands: Secondary | ICD-10-CM | POA: Diagnosis not present

## 2021-08-20 DIAGNOSIS — H40003 Preglaucoma, unspecified, bilateral: Secondary | ICD-10-CM | POA: Diagnosis not present

## 2021-08-20 DIAGNOSIS — H43812 Vitreous degeneration, left eye: Secondary | ICD-10-CM | POA: Diagnosis not present

## 2021-08-28 DIAGNOSIS — Z1231 Encounter for screening mammogram for malignant neoplasm of breast: Secondary | ICD-10-CM | POA: Diagnosis not present

## 2021-09-16 DIAGNOSIS — L820 Inflamed seborrheic keratosis: Secondary | ICD-10-CM | POA: Diagnosis not present

## 2021-09-16 DIAGNOSIS — D485 Neoplasm of uncertain behavior of skin: Secondary | ICD-10-CM | POA: Diagnosis not present

## 2021-10-13 DIAGNOSIS — R1032 Left lower quadrant pain: Secondary | ICD-10-CM | POA: Diagnosis not present

## 2021-10-13 DIAGNOSIS — K921 Melena: Secondary | ICD-10-CM | POA: Diagnosis not present

## 2021-10-13 DIAGNOSIS — K648 Other hemorrhoids: Secondary | ICD-10-CM | POA: Diagnosis not present

## 2021-10-13 DIAGNOSIS — Z8719 Personal history of other diseases of the digestive system: Secondary | ICD-10-CM | POA: Diagnosis not present

## 2021-10-13 DIAGNOSIS — K219 Gastro-esophageal reflux disease without esophagitis: Secondary | ICD-10-CM | POA: Diagnosis not present

## 2021-10-14 DIAGNOSIS — R0981 Nasal congestion: Secondary | ICD-10-CM | POA: Diagnosis not present

## 2021-10-14 DIAGNOSIS — Z01419 Encounter for gynecological examination (general) (routine) without abnormal findings: Secondary | ICD-10-CM | POA: Diagnosis not present

## 2021-10-14 DIAGNOSIS — Z6826 Body mass index (BMI) 26.0-26.9, adult: Secondary | ICD-10-CM | POA: Diagnosis not present

## 2021-10-28 DIAGNOSIS — E785 Hyperlipidemia, unspecified: Secondary | ICD-10-CM | POA: Diagnosis not present

## 2021-10-28 DIAGNOSIS — I10 Essential (primary) hypertension: Secondary | ICD-10-CM | POA: Diagnosis not present

## 2021-10-28 DIAGNOSIS — R7303 Prediabetes: Secondary | ICD-10-CM | POA: Diagnosis not present

## 2021-10-28 DIAGNOSIS — E039 Hypothyroidism, unspecified: Secondary | ICD-10-CM | POA: Diagnosis not present

## 2022-01-13 DIAGNOSIS — J309 Allergic rhinitis, unspecified: Secondary | ICD-10-CM | POA: Insufficient documentation

## 2022-01-13 DIAGNOSIS — Z9889 Other specified postprocedural states: Secondary | ICD-10-CM | POA: Insufficient documentation

## 2022-02-07 DIAGNOSIS — Z882 Allergy status to sulfonamides status: Secondary | ICD-10-CM | POA: Diagnosis not present

## 2022-02-07 DIAGNOSIS — J309 Allergic rhinitis, unspecified: Secondary | ICD-10-CM | POA: Diagnosis not present

## 2022-02-07 DIAGNOSIS — K2270 Barrett's esophagus without dysplasia: Secondary | ICD-10-CM | POA: Diagnosis not present

## 2022-02-07 DIAGNOSIS — Z809 Family history of malignant neoplasm, unspecified: Secondary | ICD-10-CM | POA: Diagnosis not present

## 2022-02-07 DIAGNOSIS — K219 Gastro-esophageal reflux disease without esophagitis: Secondary | ICD-10-CM | POA: Diagnosis not present

## 2022-02-07 DIAGNOSIS — Z8249 Family history of ischemic heart disease and other diseases of the circulatory system: Secondary | ICD-10-CM | POA: Diagnosis not present

## 2022-02-07 DIAGNOSIS — E785 Hyperlipidemia, unspecified: Secondary | ICD-10-CM | POA: Diagnosis not present

## 2022-02-07 DIAGNOSIS — R030 Elevated blood-pressure reading, without diagnosis of hypertension: Secondary | ICD-10-CM | POA: Diagnosis not present

## 2022-02-07 DIAGNOSIS — E039 Hypothyroidism, unspecified: Secondary | ICD-10-CM | POA: Diagnosis not present

## 2022-02-07 DIAGNOSIS — Z008 Encounter for other general examination: Secondary | ICD-10-CM | POA: Diagnosis not present

## 2022-02-07 DIAGNOSIS — B009 Herpesviral infection, unspecified: Secondary | ICD-10-CM | POA: Diagnosis not present

## 2022-02-07 DIAGNOSIS — I700 Atherosclerosis of aorta: Secondary | ICD-10-CM | POA: Diagnosis not present

## 2022-03-12 DIAGNOSIS — L821 Other seborrheic keratosis: Secondary | ICD-10-CM | POA: Diagnosis not present

## 2022-03-12 DIAGNOSIS — L820 Inflamed seborrheic keratosis: Secondary | ICD-10-CM | POA: Diagnosis not present

## 2022-04-22 DIAGNOSIS — R7989 Other specified abnormal findings of blood chemistry: Secondary | ICD-10-CM | POA: Diagnosis not present

## 2022-04-22 DIAGNOSIS — R7303 Prediabetes: Secondary | ICD-10-CM | POA: Diagnosis not present

## 2022-04-22 DIAGNOSIS — E039 Hypothyroidism, unspecified: Secondary | ICD-10-CM | POA: Diagnosis not present

## 2022-04-22 DIAGNOSIS — I10 Essential (primary) hypertension: Secondary | ICD-10-CM | POA: Diagnosis not present

## 2022-04-22 DIAGNOSIS — E785 Hyperlipidemia, unspecified: Secondary | ICD-10-CM | POA: Diagnosis not present

## 2022-04-22 DIAGNOSIS — M8580 Other specified disorders of bone density and structure, unspecified site: Secondary | ICD-10-CM | POA: Diagnosis not present

## 2022-04-22 DIAGNOSIS — M859 Disorder of bone density and structure, unspecified: Secondary | ICD-10-CM | POA: Diagnosis not present

## 2022-04-27 DIAGNOSIS — H40053 Ocular hypertension, bilateral: Secondary | ICD-10-CM | POA: Diagnosis not present

## 2022-04-27 DIAGNOSIS — H524 Presbyopia: Secondary | ICD-10-CM | POA: Diagnosis not present

## 2022-04-27 DIAGNOSIS — H04123 Dry eye syndrome of bilateral lacrimal glands: Secondary | ICD-10-CM | POA: Diagnosis not present

## 2022-04-28 DIAGNOSIS — I10 Essential (primary) hypertension: Secondary | ICD-10-CM | POA: Diagnosis not present

## 2022-04-28 DIAGNOSIS — R7303 Prediabetes: Secondary | ICD-10-CM | POA: Diagnosis not present

## 2022-04-28 DIAGNOSIS — M1990 Unspecified osteoarthritis, unspecified site: Secondary | ICD-10-CM | POA: Diagnosis not present

## 2022-04-28 DIAGNOSIS — R82998 Other abnormal findings in urine: Secondary | ICD-10-CM | POA: Diagnosis not present

## 2022-04-28 DIAGNOSIS — K219 Gastro-esophageal reflux disease without esophagitis: Secondary | ICD-10-CM | POA: Diagnosis not present

## 2022-04-28 DIAGNOSIS — G4700 Insomnia, unspecified: Secondary | ICD-10-CM | POA: Diagnosis not present

## 2022-04-28 DIAGNOSIS — Z23 Encounter for immunization: Secondary | ICD-10-CM | POA: Diagnosis not present

## 2022-04-28 DIAGNOSIS — E039 Hypothyroidism, unspecified: Secondary | ICD-10-CM | POA: Diagnosis not present

## 2022-04-28 DIAGNOSIS — Z0000 Encounter for general adult medical examination without abnormal findings: Secondary | ICD-10-CM | POA: Diagnosis not present

## 2022-04-28 DIAGNOSIS — E785 Hyperlipidemia, unspecified: Secondary | ICD-10-CM | POA: Diagnosis not present

## 2022-07-30 DIAGNOSIS — L821 Other seborrheic keratosis: Secondary | ICD-10-CM | POA: Diagnosis not present

## 2022-07-30 DIAGNOSIS — L820 Inflamed seborrheic keratosis: Secondary | ICD-10-CM | POA: Diagnosis not present

## 2022-07-30 DIAGNOSIS — D1801 Hemangioma of skin and subcutaneous tissue: Secondary | ICD-10-CM | POA: Diagnosis not present

## 2022-07-30 DIAGNOSIS — D2371 Other benign neoplasm of skin of right lower limb, including hip: Secondary | ICD-10-CM | POA: Diagnosis not present

## 2022-07-30 DIAGNOSIS — D225 Melanocytic nevi of trunk: Secondary | ICD-10-CM | POA: Diagnosis not present

## 2022-09-03 DIAGNOSIS — Z1231 Encounter for screening mammogram for malignant neoplasm of breast: Secondary | ICD-10-CM | POA: Diagnosis not present

## 2022-10-01 DIAGNOSIS — J029 Acute pharyngitis, unspecified: Secondary | ICD-10-CM | POA: Diagnosis not present

## 2022-10-01 DIAGNOSIS — R059 Cough, unspecified: Secondary | ICD-10-CM | POA: Diagnosis not present

## 2022-10-01 DIAGNOSIS — R5383 Other fatigue: Secondary | ICD-10-CM | POA: Diagnosis not present

## 2022-10-01 DIAGNOSIS — J0100 Acute maxillary sinusitis, unspecified: Secondary | ICD-10-CM | POA: Diagnosis not present

## 2022-10-01 DIAGNOSIS — Z1152 Encounter for screening for COVID-19: Secondary | ICD-10-CM | POA: Diagnosis not present

## 2022-10-01 DIAGNOSIS — J302 Other seasonal allergic rhinitis: Secondary | ICD-10-CM | POA: Diagnosis not present

## 2022-10-19 DIAGNOSIS — L723 Sebaceous cyst: Secondary | ICD-10-CM | POA: Diagnosis not present

## 2022-10-19 DIAGNOSIS — Z6826 Body mass index (BMI) 26.0-26.9, adult: Secondary | ICD-10-CM | POA: Diagnosis not present

## 2022-10-19 DIAGNOSIS — Z01419 Encounter for gynecological examination (general) (routine) without abnormal findings: Secondary | ICD-10-CM | POA: Diagnosis not present

## 2022-10-19 DIAGNOSIS — N959 Unspecified menopausal and perimenopausal disorder: Secondary | ICD-10-CM | POA: Diagnosis not present

## 2022-11-06 DIAGNOSIS — K629 Disease of anus and rectum, unspecified: Secondary | ICD-10-CM | POA: Diagnosis not present

## 2022-11-10 DIAGNOSIS — E039 Hypothyroidism, unspecified: Secondary | ICD-10-CM | POA: Diagnosis not present

## 2022-11-10 DIAGNOSIS — K219 Gastro-esophageal reflux disease without esophagitis: Secondary | ICD-10-CM | POA: Diagnosis not present

## 2022-11-10 DIAGNOSIS — E785 Hyperlipidemia, unspecified: Secondary | ICD-10-CM | POA: Diagnosis not present

## 2022-11-10 DIAGNOSIS — R7303 Prediabetes: Secondary | ICD-10-CM | POA: Diagnosis not present

## 2022-11-10 DIAGNOSIS — I10 Essential (primary) hypertension: Secondary | ICD-10-CM | POA: Diagnosis not present

## 2022-11-10 DIAGNOSIS — G4700 Insomnia, unspecified: Secondary | ICD-10-CM | POA: Diagnosis not present

## 2022-11-10 DIAGNOSIS — I700 Atherosclerosis of aorta: Secondary | ICD-10-CM | POA: Diagnosis not present

## 2022-11-10 DIAGNOSIS — R0982 Postnasal drip: Secondary | ICD-10-CM | POA: Diagnosis not present

## 2022-11-11 DIAGNOSIS — H40053 Ocular hypertension, bilateral: Secondary | ICD-10-CM | POA: Diagnosis not present

## 2022-11-11 DIAGNOSIS — H2513 Age-related nuclear cataract, bilateral: Secondary | ICD-10-CM | POA: Diagnosis not present

## 2022-11-11 DIAGNOSIS — H40003 Preglaucoma, unspecified, bilateral: Secondary | ICD-10-CM | POA: Diagnosis not present

## 2022-11-11 DIAGNOSIS — H524 Presbyopia: Secondary | ICD-10-CM | POA: Diagnosis not present

## 2022-11-11 DIAGNOSIS — H35363 Drusen (degenerative) of macula, bilateral: Secondary | ICD-10-CM | POA: Diagnosis not present

## 2022-11-11 DIAGNOSIS — H04123 Dry eye syndrome of bilateral lacrimal glands: Secondary | ICD-10-CM | POA: Diagnosis not present

## 2022-11-27 DIAGNOSIS — J302 Other seasonal allergic rhinitis: Secondary | ICD-10-CM | POA: Diagnosis not present

## 2022-11-27 DIAGNOSIS — Z1152 Encounter for screening for COVID-19: Secondary | ICD-10-CM | POA: Diagnosis not present

## 2022-11-27 DIAGNOSIS — R051 Acute cough: Secondary | ICD-10-CM | POA: Diagnosis not present

## 2022-11-27 DIAGNOSIS — J029 Acute pharyngitis, unspecified: Secondary | ICD-10-CM | POA: Diagnosis not present

## 2022-11-27 DIAGNOSIS — J069 Acute upper respiratory infection, unspecified: Secondary | ICD-10-CM | POA: Diagnosis not present

## 2022-11-27 DIAGNOSIS — R062 Wheezing: Secondary | ICD-10-CM | POA: Diagnosis not present

## 2022-11-27 DIAGNOSIS — I10 Essential (primary) hypertension: Secondary | ICD-10-CM | POA: Diagnosis not present

## 2023-01-20 DIAGNOSIS — W57XXXA Bitten or stung by nonvenomous insect and other nonvenomous arthropods, initial encounter: Secondary | ICD-10-CM | POA: Diagnosis not present

## 2023-01-20 DIAGNOSIS — S80861A Insect bite (nonvenomous), right lower leg, initial encounter: Secondary | ICD-10-CM | POA: Diagnosis not present

## 2023-02-06 DIAGNOSIS — H269 Unspecified cataract: Secondary | ICD-10-CM | POA: Diagnosis not present

## 2023-02-06 DIAGNOSIS — I10 Essential (primary) hypertension: Secondary | ICD-10-CM | POA: Diagnosis not present

## 2023-02-06 DIAGNOSIS — J309 Allergic rhinitis, unspecified: Secondary | ICD-10-CM | POA: Diagnosis not present

## 2023-02-06 DIAGNOSIS — K2270 Barrett's esophagus without dysplasia: Secondary | ICD-10-CM | POA: Diagnosis not present

## 2023-02-06 DIAGNOSIS — M1990 Unspecified osteoarthritis, unspecified site: Secondary | ICD-10-CM | POA: Diagnosis not present

## 2023-02-06 DIAGNOSIS — I2510 Atherosclerotic heart disease of native coronary artery without angina pectoris: Secondary | ICD-10-CM | POA: Diagnosis not present

## 2023-02-06 DIAGNOSIS — Z008 Encounter for other general examination: Secondary | ICD-10-CM | POA: Diagnosis not present

## 2023-02-06 DIAGNOSIS — K219 Gastro-esophageal reflux disease without esophagitis: Secondary | ICD-10-CM | POA: Diagnosis not present

## 2023-02-06 DIAGNOSIS — E785 Hyperlipidemia, unspecified: Secondary | ICD-10-CM | POA: Diagnosis not present

## 2023-02-06 DIAGNOSIS — Z8249 Family history of ischemic heart disease and other diseases of the circulatory system: Secondary | ICD-10-CM | POA: Diagnosis not present

## 2023-02-06 DIAGNOSIS — H409 Unspecified glaucoma: Secondary | ICD-10-CM | POA: Diagnosis not present

## 2023-02-06 DIAGNOSIS — M8580 Other specified disorders of bone density and structure, unspecified site: Secondary | ICD-10-CM | POA: Diagnosis not present

## 2023-02-06 DIAGNOSIS — E039 Hypothyroidism, unspecified: Secondary | ICD-10-CM | POA: Diagnosis not present

## 2023-02-24 DIAGNOSIS — L570 Actinic keratosis: Secondary | ICD-10-CM | POA: Diagnosis not present

## 2023-02-24 DIAGNOSIS — L820 Inflamed seborrheic keratosis: Secondary | ICD-10-CM | POA: Diagnosis not present

## 2023-03-04 ENCOUNTER — Ambulatory Visit: Payer: Medicare HMO | Admitting: Podiatry

## 2023-03-04 ENCOUNTER — Encounter: Payer: Self-pay | Admitting: Podiatry

## 2023-03-04 DIAGNOSIS — D2371 Other benign neoplasm of skin of right lower limb, including hip: Secondary | ICD-10-CM | POA: Diagnosis not present

## 2023-03-04 NOTE — Progress Notes (Signed)
Subjective:  Patient ID: Monique Ellis, female    DOB: 06/11/1946,  MRN: [id number] HPI Chief Complaint  Patient presents with   Toe Pain    4th and 5th toe right - corns rubbing each other for months, shoes are making them burn, tried trimming and padding, same issue as before, trimming it really helped   New Patient (Initial Visit)    Est pt 06/2019    77 y.o. female presents with the above complaint.   ROS: Denies fever chills nausea vomit muscle aches pains calf pain back pain chest pain shortness of breath.  Past Medical History:  Diagnosis Date   Allergy    Arthritis    bilateral hands   Barrett's esophagus    GERD (gastroesophageal reflux disease)    Hyperlipidemia    Hypothyroidism    Seasonal allergies    Past Surgical History:  Procedure Laterality Date   ABDOMINAL HYSTERECTOMY     APPENDECTOMY  1954   CARPAL TUNNEL RELEASE     bilateral   NASAL SINUS SURGERY     x 3   NEUROPLASTY / TRANSPOSITION MEDIAN NERVE AT CARPAL TUNNEL BILATERAL  2000   ROTATOR CUFF REPAIR  2009   right   TONSILLECTOMY  1980   trigger thumb  2010   right    Current Outpatient Medications:    albuterol (VENTOLIN HFA) 108 (90 Base) MCG/ACT inhaler, 2 puffs as needed for wheezing, tightness Inhalation every 4 hrs for 30 days, Disp: , Rfl:    Azelastine HCl 137 MCG/SPRAY SOLN, 1 to 2 sprays in each nostril twice daily, Disp: , Rfl:    aspirin 81 MG tablet, Take 81 mg by mouth daily., Disp: , Rfl:    Cholecalciferol (VITAMIN D-3) 5000 UNITS TABS, Take by mouth daily., Disp: , Rfl:    docusate sodium (COLACE) 250 MG capsule, Take 250 mg by mouth 2 (two) times daily., Disp: , Rfl:    esomeprazole (NEXIUM) 40 MG capsule, Take 1 capsule (40 mg total) by mouth 2 (two) times daily., Disp: 180 capsule, Rfl: 3   estradiol (VIVELLE-DOT) 0.025 MG/24HR, Place 1 patch onto the skin 2 (two) times a week., Disp: , Rfl:    famotidine (PEPCID) 20 MG tablet, Take 1 tablet (20 mg total) by mouth at bedtime.,  Disp: 30 tablet, Rfl: 0   fexofenadine (ALLEGRA) 180 MG tablet, Take 180 mg by mouth as needed. , Disp: , Rfl:    levothyroxine (SYNTHROID, LEVOTHROID) 75 MCG tablet, Take 75 mcg by mouth daily., Disp: , Rfl:    loratadine (CLARITIN) 10 MG tablet, Take 10 mg by mouth as needed for allergies. , Disp: , Rfl:    simvastatin (ZOCOR) 40 MG tablet, Take 40 mg by mouth daily., Disp: , Rfl:   Current Facility-Administered Medications:    0.9 %  sodium chloride infusion, 500 mL, Intravenous, Once, Pyrtle, Carie Caddy, MD  Allergies  Allergen Reactions   Cefdinir     Other Reaction(s): face red & splotchy   Simvastatin     Other Reaction(s): myalgias   Sulfa Antibiotics    Review of Systems Objective:  There were no vitals filed for this visit.  General: Well developed, nourished, in no acute distress, alert and oriented x3   Dermatological: Skin is warm, dry and supple bilateral. Nails x 10 are well maintained; remaining integument appears unremarkable at this time. There are no open sores, no preulcerative lesions, no rash or signs of infection present.  Reactive hyperkeratotic benign lesion lateral  aspect of the PIPJ fourth digit right foot.  Mild underlying bursitis.   Vascular: Dorsalis Pedis artery and Posterior Tibial artery pedal pulses are 2/4 bilateral with immedate capillary fill time. Pedal hair growth present. No varicosities and no lower extremity edema present bilateral.   Neruologic: Grossly intact via light touch bilateral. Vibratory intact via tuning fork bilateral. Protective threshold with Semmes Wienstein monofilament intact to all pedal sites bilateral. Patellar and Achilles deep tendon reflexes 2+ bilateral. No Babinski or clonus noted bilateral.   Musculoskeletal: No gross boney pedal deformities bilateral. No pain, crepitus, or limitation noted with foot and ankle range of motion bilateral. Muscular strength 5/5 in all groups tested bilateral.  Gait: Unassisted, Nonantalgic.     Radiographs:  None taken  Assessment & Plan:   Assessment: Painful benign skin lesion with bursitis lateral aspect fifth digit right foot  Plan: I injected the bursa today with 2 mg of dexamethasone and local anesthetic.  Debrided reactive hyperkeratotic lesion lateral aspect fourth digit PIPJ right foot.  Follow-up as needed.    Willye Javier T. [place], [place]

## 2023-04-29 DIAGNOSIS — K219 Gastro-esophageal reflux disease without esophagitis: Secondary | ICD-10-CM | POA: Diagnosis not present

## 2023-04-29 DIAGNOSIS — I10 Essential (primary) hypertension: Secondary | ICD-10-CM | POA: Diagnosis not present

## 2023-04-29 DIAGNOSIS — G4700 Insomnia, unspecified: Secondary | ICD-10-CM | POA: Diagnosis not present

## 2023-04-29 DIAGNOSIS — E039 Hypothyroidism, unspecified: Secondary | ICD-10-CM | POA: Diagnosis not present

## 2023-04-29 DIAGNOSIS — L989 Disorder of the skin and subcutaneous tissue, unspecified: Secondary | ICD-10-CM | POA: Diagnosis not present

## 2023-04-29 DIAGNOSIS — M25512 Pain in left shoulder: Secondary | ICD-10-CM | POA: Diagnosis not present

## 2023-04-29 DIAGNOSIS — Z0000 Encounter for general adult medical examination without abnormal findings: Secondary | ICD-10-CM | POA: Diagnosis not present

## 2023-04-29 DIAGNOSIS — R7303 Prediabetes: Secondary | ICD-10-CM | POA: Diagnosis not present

## 2023-04-29 DIAGNOSIS — M25522 Pain in left elbow: Secondary | ICD-10-CM | POA: Diagnosis not present

## 2023-04-29 DIAGNOSIS — Z23 Encounter for immunization: Secondary | ICD-10-CM | POA: Diagnosis not present

## 2023-04-29 DIAGNOSIS — Z1331 Encounter for screening for depression: Secondary | ICD-10-CM | POA: Diagnosis not present

## 2023-04-29 DIAGNOSIS — I700 Atherosclerosis of aorta: Secondary | ICD-10-CM | POA: Diagnosis not present

## 2023-04-29 DIAGNOSIS — E785 Hyperlipidemia, unspecified: Secondary | ICD-10-CM | POA: Diagnosis not present

## 2023-04-29 DIAGNOSIS — Z1389 Encounter for screening for other disorder: Secondary | ICD-10-CM | POA: Diagnosis not present

## 2023-05-17 DIAGNOSIS — H40053 Ocular hypertension, bilateral: Secondary | ICD-10-CM | POA: Diagnosis not present

## 2023-05-19 DIAGNOSIS — L820 Inflamed seborrheic keratosis: Secondary | ICD-10-CM | POA: Diagnosis not present

## 2023-05-20 DIAGNOSIS — R69 Illness, unspecified: Secondary | ICD-10-CM | POA: Diagnosis not present

## 2023-07-13 DIAGNOSIS — H0102B Squamous blepharitis left eye, upper and lower eyelids: Secondary | ICD-10-CM | POA: Diagnosis not present

## 2023-07-13 DIAGNOSIS — H0102A Squamous blepharitis right eye, upper and lower eyelids: Secondary | ICD-10-CM | POA: Diagnosis not present

## 2023-08-04 DIAGNOSIS — D2272 Melanocytic nevi of left lower limb, including hip: Secondary | ICD-10-CM | POA: Diagnosis not present

## 2023-08-04 DIAGNOSIS — D485 Neoplasm of uncertain behavior of skin: Secondary | ICD-10-CM | POA: Diagnosis not present

## 2023-08-04 DIAGNOSIS — D2271 Melanocytic nevi of right lower limb, including hip: Secondary | ICD-10-CM | POA: Diagnosis not present

## 2023-08-04 DIAGNOSIS — M19011 Primary osteoarthritis, right shoulder: Secondary | ICD-10-CM | POA: Diagnosis not present

## 2023-08-04 DIAGNOSIS — L430 Hypertrophic lichen planus: Secondary | ICD-10-CM | POA: Diagnosis not present

## 2023-08-04 DIAGNOSIS — D2262 Melanocytic nevi of left upper limb, including shoulder: Secondary | ICD-10-CM | POA: Diagnosis not present

## 2023-08-04 DIAGNOSIS — L570 Actinic keratosis: Secondary | ICD-10-CM | POA: Diagnosis not present

## 2023-08-04 DIAGNOSIS — D225 Melanocytic nevi of trunk: Secondary | ICD-10-CM | POA: Diagnosis not present

## 2023-08-04 DIAGNOSIS — L821 Other seborrheic keratosis: Secondary | ICD-10-CM | POA: Diagnosis not present

## 2023-08-04 DIAGNOSIS — L820 Inflamed seborrheic keratosis: Secondary | ICD-10-CM | POA: Diagnosis not present

## 2023-08-04 DIAGNOSIS — D2261 Melanocytic nevi of right upper limb, including shoulder: Secondary | ICD-10-CM | POA: Diagnosis not present

## 2023-09-09 DIAGNOSIS — Z1231 Encounter for screening mammogram for malignant neoplasm of breast: Secondary | ICD-10-CM | POA: Diagnosis not present

## 2023-11-01 DIAGNOSIS — E039 Hypothyroidism, unspecified: Secondary | ICD-10-CM | POA: Diagnosis not present

## 2023-11-01 DIAGNOSIS — M25511 Pain in right shoulder: Secondary | ICD-10-CM | POA: Diagnosis not present

## 2023-11-01 DIAGNOSIS — G4700 Insomnia, unspecified: Secondary | ICD-10-CM | POA: Diagnosis not present

## 2023-11-01 DIAGNOSIS — R140 Abdominal distension (gaseous): Secondary | ICD-10-CM | POA: Diagnosis not present

## 2023-11-01 DIAGNOSIS — I10 Essential (primary) hypertension: Secondary | ICD-10-CM | POA: Diagnosis not present

## 2023-11-01 DIAGNOSIS — R7303 Prediabetes: Secondary | ICD-10-CM | POA: Diagnosis not present

## 2023-11-01 DIAGNOSIS — Z23 Encounter for immunization: Secondary | ICD-10-CM | POA: Diagnosis not present

## 2023-11-01 DIAGNOSIS — E785 Hyperlipidemia, unspecified: Secondary | ICD-10-CM | POA: Diagnosis not present

## 2023-11-01 DIAGNOSIS — K219 Gastro-esophageal reflux disease without esophagitis: Secondary | ICD-10-CM | POA: Diagnosis not present

## 2023-11-01 DIAGNOSIS — I700 Atherosclerosis of aorta: Secondary | ICD-10-CM | POA: Diagnosis not present

## 2023-11-01 DIAGNOSIS — R0982 Postnasal drip: Secondary | ICD-10-CM | POA: Diagnosis not present

## 2024-01-31 DIAGNOSIS — H2513 Age-related nuclear cataract, bilateral: Secondary | ICD-10-CM | POA: Diagnosis not present

## 2024-01-31 DIAGNOSIS — H40053 Ocular hypertension, bilateral: Secondary | ICD-10-CM | POA: Diagnosis not present

## 2024-01-31 DIAGNOSIS — H40003 Preglaucoma, unspecified, bilateral: Secondary | ICD-10-CM | POA: Diagnosis not present

## 2024-01-31 DIAGNOSIS — H04123 Dry eye syndrome of bilateral lacrimal glands: Secondary | ICD-10-CM | POA: Diagnosis not present

## 2024-01-31 DIAGNOSIS — H524 Presbyopia: Secondary | ICD-10-CM | POA: Diagnosis not present

## 2024-02-02 DIAGNOSIS — M1990 Unspecified osteoarthritis, unspecified site: Secondary | ICD-10-CM | POA: Diagnosis not present

## 2024-02-02 DIAGNOSIS — Z809 Family history of malignant neoplasm, unspecified: Secondary | ICD-10-CM | POA: Diagnosis not present

## 2024-02-02 DIAGNOSIS — I2510 Atherosclerotic heart disease of native coronary artery without angina pectoris: Secondary | ICD-10-CM | POA: Diagnosis not present

## 2024-02-02 DIAGNOSIS — E785 Hyperlipidemia, unspecified: Secondary | ICD-10-CM | POA: Diagnosis not present

## 2024-02-02 DIAGNOSIS — I700 Atherosclerosis of aorta: Secondary | ICD-10-CM | POA: Diagnosis not present

## 2024-02-02 DIAGNOSIS — Z8249 Family history of ischemic heart disease and other diseases of the circulatory system: Secondary | ICD-10-CM | POA: Diagnosis not present

## 2024-02-02 DIAGNOSIS — K559 Vascular disorder of intestine, unspecified: Secondary | ICD-10-CM | POA: Diagnosis not present

## 2024-02-02 DIAGNOSIS — M8580 Other specified disorders of bone density and structure, unspecified site: Secondary | ICD-10-CM | POA: Diagnosis not present

## 2024-02-02 DIAGNOSIS — Z008 Encounter for other general examination: Secondary | ICD-10-CM | POA: Diagnosis not present

## 2024-02-02 DIAGNOSIS — E1136 Type 2 diabetes mellitus with diabetic cataract: Secondary | ICD-10-CM | POA: Diagnosis not present

## 2024-02-02 DIAGNOSIS — E039 Hypothyroidism, unspecified: Secondary | ICD-10-CM | POA: Diagnosis not present

## 2024-02-02 DIAGNOSIS — K5900 Constipation, unspecified: Secondary | ICD-10-CM | POA: Diagnosis not present

## 2024-02-02 DIAGNOSIS — I10 Essential (primary) hypertension: Secondary | ICD-10-CM | POA: Diagnosis not present

## 2024-03-09 ENCOUNTER — Ambulatory Visit: Admitting: Podiatry

## 2024-03-09 DIAGNOSIS — D2371 Other benign neoplasm of skin of right lower limb, including hip: Secondary | ICD-10-CM

## 2024-03-09 DIAGNOSIS — M19071 Primary osteoarthritis, right ankle and foot: Secondary | ICD-10-CM

## 2024-03-09 DIAGNOSIS — M19079 Primary osteoarthritis, unspecified ankle and foot: Secondary | ICD-10-CM

## 2024-03-09 NOTE — Progress Notes (Signed)
 She presents today chief complaint of a painful fourth toe of the right foot.  She also has a painful callus to the plantar aspect of the foot.  Objective: Vital signs stable alert and oriented x 3.  Pulses are palpable.  Hammertoe deformity and 4th and 5th digits resulting in a bursitis to the lateral PIPJ fourth digit right foot.  Reactive benign skin lesion plantar aspect of the right foot.  Assessment: Benign skin lesion plantar right foot.  Bursitis lateral aspect of the fourth toe left foot.  Hammertoe deformity.  Plan: I injected dexamethasone and local anesthetic to the lateral aspect of the PIPJ fourth digit right foot.  And debrided the benign skin lesion plantarly.

## 2024-04-24 DIAGNOSIS — M19011 Primary osteoarthritis, right shoulder: Secondary | ICD-10-CM | POA: Diagnosis not present

## 2024-04-24 DIAGNOSIS — M19012 Primary osteoarthritis, left shoulder: Secondary | ICD-10-CM | POA: Diagnosis not present

## 2024-05-08 NOTE — Progress Notes (Signed)
 Monique Ellis                                          MRN: [id number]   05/08/2024   The [facility] Quality Team Specialist reviewed this patient medical record for the purposes of chart review for care gap closure. The following were reviewed: chart review for care gap closure-controlling blood pressure.    [facility] Quality Team

## 2024-05-11 DIAGNOSIS — E7849 Other hyperlipidemia: Secondary | ICD-10-CM | POA: Diagnosis not present

## 2024-05-16 DIAGNOSIS — G4700 Insomnia, unspecified: Secondary | ICD-10-CM | POA: Diagnosis not present

## 2024-05-16 DIAGNOSIS — E785 Hyperlipidemia, unspecified: Secondary | ICD-10-CM | POA: Diagnosis not present

## 2024-05-16 DIAGNOSIS — I10 Essential (primary) hypertension: Secondary | ICD-10-CM | POA: Diagnosis not present

## 2024-05-16 DIAGNOSIS — E039 Hypothyroidism, unspecified: Secondary | ICD-10-CM | POA: Diagnosis not present

## 2024-05-16 DIAGNOSIS — Z1339 Encounter for screening examination for other mental health and behavioral disorders: Secondary | ICD-10-CM | POA: Diagnosis not present

## 2024-05-16 DIAGNOSIS — K219 Gastro-esophageal reflux disease without esophagitis: Secondary | ICD-10-CM | POA: Diagnosis not present

## 2024-05-16 DIAGNOSIS — M25511 Pain in right shoulder: Secondary | ICD-10-CM | POA: Diagnosis not present

## 2024-05-16 DIAGNOSIS — Z1331 Encounter for screening for depression: Secondary | ICD-10-CM | POA: Diagnosis not present

## 2024-05-16 DIAGNOSIS — Z23 Encounter for immunization: Secondary | ICD-10-CM | POA: Diagnosis not present

## 2024-05-16 DIAGNOSIS — Z0000 Encounter for general adult medical examination without abnormal findings: Secondary | ICD-10-CM | POA: Diagnosis not present

## 2024-05-16 DIAGNOSIS — I700 Atherosclerosis of aorta: Secondary | ICD-10-CM | POA: Diagnosis not present

## 2024-05-16 DIAGNOSIS — R7303 Prediabetes: Secondary | ICD-10-CM | POA: Diagnosis not present

## 2024-06-19 DIAGNOSIS — N390 Urinary tract infection, site not specified: Secondary | ICD-10-CM | POA: Diagnosis not present

## 2024-07-06 ENCOUNTER — Ambulatory Visit: Admitting: Podiatry
